# Patient Record
Sex: Female | Born: 1965 | Hispanic: Yes | Marital: Married | Smoking: Current every day smoker
Health system: Southern US, Community
[De-identification: ages and names within clinical notes are randomized; demographics above are authoritative.]

## PROBLEM LIST (undated history)

## (undated) DIAGNOSIS — Z9889 Other specified postprocedural states: Secondary | ICD-10-CM

## (undated) DIAGNOSIS — I10 Essential (primary) hypertension: Secondary | ICD-10-CM

## (undated) DIAGNOSIS — R112 Nausea with vomiting, unspecified: Secondary | ICD-10-CM

## (undated) HISTORY — PX: TUBAL LIGATION: SHX77

## (undated) HISTORY — DX: Essential (primary) hypertension: I10

---

## 2005-07-25 ENCOUNTER — Other Ambulatory Visit: Admission: RE | Admit: 2005-07-25 | Discharge: 2005-07-25 | Payer: Self-pay | Admitting: Family Medicine

## 2015-01-06 ENCOUNTER — Telehealth: Payer: Self-pay | Admitting: Family Medicine

## 2015-01-06 NOTE — Telephone Encounter (Signed)
Patient is going to talk to billing and then will let us know if decides to be seen

## 2015-07-28 ENCOUNTER — Ambulatory Visit: Payer: Self-pay | Admitting: Family Medicine

## 2016-04-20 ENCOUNTER — Ambulatory Visit: Payer: Self-pay | Admitting: Family Medicine

## 2016-04-27 ENCOUNTER — Ambulatory Visit (INDEPENDENT_AMBULATORY_CARE_PROVIDER_SITE_OTHER): Payer: Self-pay | Admitting: Pediatrics

## 2016-04-27 ENCOUNTER — Encounter (INDEPENDENT_AMBULATORY_CARE_PROVIDER_SITE_OTHER): Payer: Self-pay

## 2016-04-27 ENCOUNTER — Encounter: Payer: Self-pay | Admitting: Pediatrics

## 2016-04-27 VITALS — BP 106/76 | HR 81 | Temp 97.7°F | Ht 64.0 in | Wt 164.8 lb

## 2016-04-27 DIAGNOSIS — R51 Headache: Secondary | ICD-10-CM

## 2016-04-27 DIAGNOSIS — I779 Disorder of arteries and arterioles, unspecified: Secondary | ICD-10-CM

## 2016-04-27 DIAGNOSIS — G8929 Other chronic pain: Secondary | ICD-10-CM

## 2016-04-27 DIAGNOSIS — R0981 Nasal congestion: Secondary | ICD-10-CM

## 2016-04-27 DIAGNOSIS — I739 Peripheral vascular disease, unspecified: Principal | ICD-10-CM

## 2016-04-27 NOTE — Progress Notes (Signed)
  Subjective:   Patient ID: Vicki Robinson, female    DOB: April 12, 1966, 50 y.o.   MRN: [id number] CC: New Patient (Initial Visit)  HPI: Vicki Robinson is a 50 y.o. female presenting for New Patient (Initial Visit)  Recently had dental xrays taken Small R side carotid artery calcification seen on film, asked to follow up with PCP No symptoms Has never had stroke/heart attack Does smoke about 5 cig a day Interested in quitting, husband is a heavy smoker, has been hard Doesn't want medication to help with quitting now Denies weakness, vision changes, blurry vision, sensation changes, focal neurologic findings No trouble with BP, never needed to be on medication Has regular headaches, takes BC powder daily No history of diabetes Gets blood work down at gynecologist yearly Says her total cholesterol was 202 this past year, doesn't remember other numbers  PMH: tobacco use  Family History  Problem Relation Age of Onset  . Arthritis Mother   . Depression Mother   . Hypertension Mother   . Miscarriages / [place] Mother   . Stroke Mother   . Arthritis Brother   . COPD Brother   . Hearing loss Brother   . Cancer Maternal Grandmother   mother might have had mini strokes, pt isnt sure  Social History   Social History  . Marital status: Unknown    Spouse name: N/A  . Number of children: N/A  . Years of education: N/A   Social History Main Topics  . Smoking status: Current Every Day Smoker    Packs/day: 0.25    Types: Cigarettes  . Smokeless tobacco: Never Used  . Alcohol use No  . Drug use: No  . Sexual activity: Not Asked   Other Topics Concern  . None   Social History Narrative  . None   ROS: All systems negative other than what is in HPI  Objective:    BP 106/76   Pulse 81   Temp 97.7 F (36.5 C) (Oral)   Ht 5\' 4"  (1.626 m)   Wt 164 lb 12.8 oz (74.8 kg)   BMI 28.29 kg/m   Wt Readings from Last 3 Encounters:  04/27/16 164 lb 12.8 oz (74.8 kg)    Gen: NAD,  alert, cooperative with exam, NCAT, some congestion EYES: EOMI, no conjunctival injection, or no icterus ENT:  TMs dull gray b/l, OP with mild erythema LYMPH: no cervical LAD CV: NRRR, normal S1/S2, no murmur, distal pulses 2+ b/l, no carotid bruits Resp: CTABL, no wheezes, normal WOB Abd: +BS, soft, NTND. no guarding or organomegaly Ext: No edema, warm Neuro: Alert and oriented, strength equal b/l UE and LE, coordination grossly normal MSK: normal muscle bulk  Assessment & Plan:  Vicki Robinson was seen today for new patient (initial visit), follow up med problems  Diagnoses and all orders for this visit:  Right-sided carotid artery disease (HCC) Asymptomatic Small calcification seen likely in carotid artery on panorex dental film Discussed options Pt without symptoms, so will have her take daily aspirin Cont to work on smoking cessation  Chronic nonintractable headache, unspecified headache type Discussed rebound headaches, try to decrease caffeine, dont take OTC meds daily  Nasal congestion Try flonase  Follow up plan: prn Rex Krasarol Dolores Ewing, MD [facility]

## 2016-04-27 NOTE — Patient Instructions (Signed)
Take baby aspirin daily

## 2016-05-02 ENCOUNTER — Ambulatory Visit: Payer: Self-pay | Admitting: Family Medicine

## 2016-05-27 ENCOUNTER — Ambulatory Visit: Payer: Self-pay | Admitting: Physician Assistant

## 2016-12-26 ENCOUNTER — Ambulatory Visit (INDEPENDENT_AMBULATORY_CARE_PROVIDER_SITE_OTHER): Payer: Self-pay

## 2016-12-26 ENCOUNTER — Ambulatory Visit (INDEPENDENT_AMBULATORY_CARE_PROVIDER_SITE_OTHER): Payer: Self-pay | Admitting: Family

## 2016-12-26 ENCOUNTER — Encounter: Payer: Self-pay | Admitting: Family

## 2016-12-26 VITALS — BP 113/76 | HR 82 | Temp 98.6°F | Ht 64.0 in | Wt 160.0 lb

## 2016-12-26 DIAGNOSIS — K5900 Constipation, unspecified: Secondary | ICD-10-CM

## 2016-12-26 DIAGNOSIS — R1084 Generalized abdominal pain: Secondary | ICD-10-CM

## 2016-12-26 MED ORDER — POLYETHYLENE GLYCOL 3350 17 G PO PACK: 17.0000 g | PACK | Freq: Every day | ORAL | 6 refills | Status: DC

## 2016-12-26 NOTE — Patient Instructions (Signed)

## 2016-12-26 NOTE — Progress Notes (Signed)
   Subjective:    Patient ID: Vicki DuffMaria Lannom, female    DOB: 17-Apr-1966, 51 y.o.   MRN: [id number]  Pt presents to the office today with intermittent abdominal in RUQ and LUQ that is worse after eating. Pt states she feels bloated and has hx of constipation. States she usually has a BM every 3-4 days.  Abdominal Pain  This is a new problem. The current episode started 1 to 4 weeks ago. The onset quality is gradual. The problem occurs intermittently. The problem has been unchanged. The pain is located in the LUQ and RUQ. The pain is at a severity of 1/10. The pain is mild. The abdominal pain does not radiate. Associated symptoms include constipation and nausea. Pertinent negatives include no belching, dysuria, flatus, frequency, hematuria or vomiting. The pain is aggravated by eating. The pain is relieved by nothing. She has tried antacids for the symptoms. The treatment provided mild relief.      Review of Systems  Gastrointestinal: Positive for abdominal pain, constipation and nausea. Negative for flatus and vomiting.  Genitourinary: Negative for dysuria, frequency and hematuria.  All other systems reviewed and are negative.      Objective:   Physical Exam  Constitutional: She is oriented to person, place, and time. She appears well-developed and well-nourished. No distress.  HENT:  Head: Normocephalic.  Eyes: Pupils are equal, round, and reactive to light.  Cardiovascular: Normal rate, regular rhythm, normal heart sounds and intact distal pulses.   No murmur heard. Pulmonary/Chest: Effort normal and breath sounds normal. No respiratory distress. She has no wheezes.  Abdominal: Soft. Bowel sounds are normal. She exhibits no distension. There is tenderness (mild LLQ).  Musculoskeletal: Normal range of motion. She exhibits no edema or tenderness.  Neurological: She is alert and oriented to person, place, and time.  Skin: Skin is warm and dry.  Psychiatric: She has a normal mood and  affect. Her behavior is normal. Judgment and thought content normal.  Vitals reviewed.  KUB- Large amount of stool in colon  BP 113/76   Pulse 82   Temp 98.6 F (37 C) (Oral)   Ht 5\' 4"  (1.626 m)   Wt 160 lb (72.6 kg)   BMI 27.46 kg/m      Assessment & Plan:  1. Generalized abdominal pain - DG Abd 1 View; Future  2. Constipation, unspecified constipation type Force fluids Take Miralax daily RTO prn or if symptoms worsen  - DG Abd 1 View; Future - polyethylene glycol (MIRALAX / GLYCOLAX) packet; Take 17 g by mouth daily.  Dispense: 30 each; Refill: 6   Jannifer Rodneyhristy Lunden Stieber, FNP

## 2017-01-23 ENCOUNTER — Ambulatory Visit: Payer: Self-pay | Admitting: Family Medicine

## 2017-01-24 ENCOUNTER — Encounter: Payer: Self-pay | Admitting: Pediatrics

## 2017-02-10 ENCOUNTER — Other Ambulatory Visit: Payer: Self-pay | Admitting: Family Medicine

## 2017-02-10 ENCOUNTER — Encounter: Payer: Self-pay | Admitting: Family Medicine

## 2017-02-10 ENCOUNTER — Ambulatory Visit (INDEPENDENT_AMBULATORY_CARE_PROVIDER_SITE_OTHER): Payer: Self-pay | Admitting: Family Medicine

## 2017-02-10 VITALS — Temp 97.1°F | Ht 64.0 in | Wt 159.0 lb

## 2017-02-10 DIAGNOSIS — H6121 Impacted cerumen, right ear: Secondary | ICD-10-CM

## 2017-02-10 DIAGNOSIS — F17200 Nicotine dependence, unspecified, uncomplicated: Secondary | ICD-10-CM

## 2017-02-10 DIAGNOSIS — K5904 Chronic idiopathic constipation: Secondary | ICD-10-CM

## 2017-02-10 DIAGNOSIS — H938X3 Other specified disorders of ear, bilateral: Secondary | ICD-10-CM

## 2017-02-10 DIAGNOSIS — R030 Elevated blood-pressure reading, without diagnosis of hypertension: Secondary | ICD-10-CM | POA: Insufficient documentation

## 2017-02-10 MED ORDER — POLYETHYLENE GLYCOL 3350 17 GM/SCOOP PO POWD: 17.0000 g | Freq: Two times a day (BID) | ORAL | 0 refills | Status: DC | PRN

## 2017-02-10 NOTE — Assessment & Plan Note (Signed)
I do not think patient is actually failed MiraLAX. She likely needs a formal clean out. Instructions given for cleanout as follows: Mix 4 capfuls of MiraLAX in 32 ounces of water and drink over the course of 1-2 hours. May also use stool softener as directed. Patient to take 1 capful of MiraLAX in 8 ounces of water daily. Titration instructions reviewed with patient so that she is having one to 3 soft stools daily. Mesenteric release also performed today. See procedure note. I encouraged oral hydration, daily physical exercise, and a diet rich in fiber. Patient is also due for screening colonoscopy. I encouraged her to follow up with her primary care doctor for preventative care/annual physical exam.

## 2017-02-10 NOTE — Assessment & Plan Note (Signed)
Patient is contemplative. Currently smoking 5 cigarettes per day. She seems to recognize the impact this has on her health. I counseled her on the implications of smoking/tobacco on her overall health. Continue to address/counsel patient on tobacco use at each visit.

## 2017-02-10 NOTE — Patient Instructions (Signed)
Thank you for coming in to clinic today.  For your constipation:  1. Your symptoms are consistent with Constipation, likely cause of your General Abdominal Pain / Cramping. 2. Start with Miralax (you can get the generic version over the counter). First dose 68g (4 capfuls) in 32oz water over 1 to 2 hours for clean out. Next day start 17g or 1 capful daily, may adjust dose up or down by half a capful every few days. Recommend to take this medicine daily for next 1-2 weeks, then may need to use it longer if needed. - Goal is to have soft regular bowel movement 1-3x daily, if too runny or diarrhea, then reduce dose of the medicine  Improve water intake, hydration will help Also recommend increased vegetables, fruits, fiber intake Can try daily Metamucil or Fiber supplement at pharmacy over the counter  Follow-up if symptoms are not improving with bowel movements, or if pain worsens, develop fevers, nausea, vomiting.  If you have any other questions or concerns, please feel free to call the clinic to contact me. You may also schedule an earlier appointment if necessary.  For your sinus pressure and nasal allergies:  - Try to breathe moist air. Use a humidifier or take a steamy shower. - For nasal stuffiness, try saline nasal spray or a Neti Pot. - You can consider starting Flonase nasal spray (generic is fine) if your symptoms are not improving with the saline nasal rinses.  Directions: Spray 2 sprays in each nostril one time daily.  It may take up to 2 weeks before you notice improvement in your symptoms.  Therefore, it is important to use this consistently. - Avoid dairy products, as they can thicken phlegm.  CONTACT YOUR DOCTOR IF YOU EXPERIENCE ANY OF THE FOLLOWING: - High fever - Ear pain - Sinus-type headache - Unusually severe cold symptoms - Cough that gets worse while other cold symptoms improve - Flare up of any chronic lung problem, such as asthma - Your symptoms persist longer  than 2 weeks   Nasal Allergies Nasal allergies are a reaction to allergens in the air. Allergens are tiny specks (particles) in the air that cause your body to have an allergic reaction. Nasal allergies are not passed from person to person (contagious). They cannot be cured, but they can be controlled. Common causes of nasal allergies include:  Pollen from grasses, trees, and weeds.  House dust mites.  Pet dander.  Mold.  Follow these instructions at home:  Avoid the allergen that is causing your symptoms, if you can.  Keep windows closed. If possible, use air conditioning when there is a lot of pollen in the air.  Do not use fans in your home.  Do not hang clothes outside to dry.  Wear sunglasses to keep pollen out of your eyes.  Wash your hands right away after you touch household pets.  Take over-the-counter and prescription medicines only as told by your doctor.  Keep all follow-up visits as told by your doctor. This is important. Contact a doctor if:  You have a fever.  You have a cough that does not go away (is persistent).  You start to make whistling sounds when you breathe (wheeze).  Your symptoms do not get better with treatment.  You have thick fluid coming from your nose.  You start to have nosebleeds. Get help right away if:  Your tongue or your lips are swollen.  You have trouble breathing.  You feel light-headed or you feel  like you are going to pass out (faint).  You have cold sweats. This information is not intended to replace advice given to you by your health care provider. Make sure you discuss any questions you have with your health care provider. Document Released: 09/29/2010 Document Revised: 11/05/2015 Document Reviewed: 12/10/2014 Elsevier Interactive Patient Education  [employer].   However, if your symptoms get significantly worse, please go to the Emergency Department to seek immediate medical attention.

## 2017-02-10 NOTE — Progress Notes (Addendum)
Subjective: CC:ear fullness, constipation PCP: [name], MD RUE:AVWUJ Vicki Robinson is a 51 y.o. female presenting to clinic today for:  Constipation Patient seen in July for constipation. She had an abdominal x-ray done at that time as well, which supported a diagnosis. She was discharged with MiraLAX for home use. Unfortunately she said that the prescription version was $100+. She instead obtained an over-the-counter version. She notes that she was taking 1 capsule twice a day with little to no improvement in symptoms. She has also been using a daily stool softener. She reports that last night she actually ended up using half a bottle of magnesium citrate as well as 2 stool softeners. She notes a small hard bowel movement off of this. She reports fair fluid intake, citing that she drinks between 4 and 5 glasses of water daily. She reports good fiber intake, citing that she eats fruit daily. She denies excessive caffeine use. She is active for at least 30 minutes daily, walking.  She reports that she normally has a bowel movement maybe every 3-4 days. Bowel movements are nonbloody, no melena. She denies straining, rectal pain, rectal itching or any other signs or symptoms of hemorrhoids.  She has not yet had a colonoscopy because she is uninsured. Denies nausea, vomiting, abdominal pain, early satiety. Endorses reflux occasionally, especially after eating acidic/greasy foods. This is relieved by Tums.  Ear fullness Patient reports a three-day history of intermittent ear fullness and muffled hearing. She denies Q-tip use. She reports associated sinus congestion and pressure. She reports feeling lightheaded briefly at work earlier today, which is why she scheduled this appointment for evaluation. No loss of consciousness, no falls, no gait abnormalities, no visual disturbance, no vertigo. She denies fevers, chills, sick contacts, purulence from sinuses, cough, congestion, rhinorrhea. She endorses nasal  congestion. She reports a history of headaches, for which she takes a BC powder daily. She notes that when she stops taking BC powder she has rebound headaches. No nausea, vomiting, neurologic deficits.   Allergies  Allergen Reactions  . Penicillins Hives   No past medical history on file. Family History  Problem Relation Age of Onset  . Arthritis Mother   . Depression Mother   . Hypertension Mother   . Miscarriages / [place] Mother   . Stroke Mother   . Arthritis Brother   . COPD Brother   . Hearing loss Brother   . Cancer Maternal Grandmother    Social Hx: daily smoker, 5 cig/d.Current medications reviewed.   ROS: Per HPI  Objective: Office vital signs reviewed. BP 142/90. Temperature (!) 97.1 F (36.2 C), temperature source Oral, height 5\' 4"  (1.626 m), weight 159 lb (72.1 kg).  Physical Examination:  General: Awake, alert, well nourished, well appearing female, No acute distress HEENT: Normal; no sinus tenderness to palpation.    Neck: No masses palpated. No lymphadenopathy    Ears: R Tympanic membrane intact Initially occluded by cerumen. After irrigation, right TM intact w/ normal light reflex, no erythema, no bulging; left TM intact with normal light reflex, no erythema, no bulging. Bilateral external auditory canals unremarkable.    Eyes: PERRLA, extraocular membranes intact, sclera white    Nose: nasal turbinates moist but edematous. No erythema, no nasal discharge    Throat: moist mucus membranes, no erythema, no tonsillar exudate.  Airway is patent Cardio: regular rate and rhythm, S1S2 heard, no murmurs appreciated Pulm: clear to auscultation bilaterally, no wheezes, rhonchi or rales; normal work of breathing on room air  GI: Soft, mild epigastric tenderness, nondistended,+ bowel sounds 4. No palpable masses. No hepato-splenomegaly. MSK: normal gait and normal station Skin: dry; intact; no rashes or lesions Neuro:No nystagmus, no focal neurologic  deficits.  Orthostatic VS for the past 24 hrs:  BP- Lying Pulse- Lying BP- Sitting Pulse- Sitting BP- Standing at 0 minutes Pulse- Standing at 0 minutes  02/10/17 1526 143/83 68 148/86 68 142/90 79   OMT procedure note: Verbal consent was given for OMT procedure. Patient was placed in supine position with her knee slightly flexed. Providers hands were placed just medial to the ASIS and abdomen was gently lifted in a scooping manner. This position was held for approximately 30 seconds. Attention was then paid to the transverse colon, where again providers hands were placed in a scooping motion and held for approximately 30 seconds. This technique was repeated along descending/sigmoid colon. Patient tolerated procedure well. There were no immediate complications.  Assessment/ Plan: 51 y.o. female   Chronic idiopathic constipation I do not think patient is actually failed MiraLAX. She likely needs a formal clean out. Instructions given for cleanout as follows: Mix 4 capfuls of MiraLAX in 32 ounces of water and drink over the course of 1-2 hours. May also use stool softener as directed. Patient to take 1 capful of MiraLAX in 8 ounces of water daily. Titration instructions reviewed with patient so that she is having one to 3 soft stools daily. Mesenteric release also performed today. See procedure note. I encouraged oral hydration, daily physical exercise, and a diet rich in fiber. Patient is also due for screening colonoscopy. I encouraged her to follow up with her primary care doctor for preventative care/annual physical exam.  Ear fullness, bilateral No evidence of infection on exam. Her right ear was remarkable for cerumen impaction. This was irrigated and removed during today's exam. Because she had reported sensation of lightheadedness, orthostatic vital signs were obtained which were negative. She had no nystagmus on exam. I do not think this is BPPV but rather ear fullness related to sinus  pressure/fluid. I encouraged her to stop smoking #1. I reviewed sinus rinses/saline nasal spray. If this is ineffective, patient may use Flonase nasal spray 2 sprays in each nostril once daily. I reviewed with her that this may take up to 2 weeks before she sees a noticeable effect. I encouraged her to use this consistently. Pseudoephedrine was considered, but patient with elevated blood pressures today. For this reason, this medication was not recommended. Return precautions were reviewed. Patient to follow-up when necessary.  Elevated blood pressure reading Blood pressures were elevated slightly today. It appears that she's been normotensive in the past. For this reason, No medications were initiated today. I did encourage her to follow up with her primary care doctor regarding her blood pressure. If persistently elevated would initiate antihypertensive, especially in the setting of right-sided carotid artery disease. Patient voiced good understanding.  Tobacco use disorder Patient is contemplative. Currently smoking 5 cigarettes per day. She seems to recognize the impact this has on her health. I counseled her on the implications of smoking/tobacco on her overall health. Continue to address/counsel patient on tobacco use at each visit.    Raliegh IpAshly M Gottschalk, DO [facility] [phone]

## 2017-02-10 NOTE — Assessment & Plan Note (Addendum)
Blood pressures were elevated slightly today. It appears that she's been normotensive in the past. For this reason, No medications were initiated today. I did encourage her to follow up with her primary care doctor regarding her blood pressure. If persistently elevated would initiate antihypertensive, especially in the setting of right-sided carotid artery disease. Patient voiced good understanding.

## 2017-02-10 NOTE — Assessment & Plan Note (Addendum)
No evidence of infection on exam. Her right ear was remarkable for cerumen impaction. This was irrigated and removed during today's exam. Because she had reported sensation of lightheadedness, orthostatic vital signs were obtained which were negative. She had no nystagmus on exam. I do not think this is BPPV but rather ear fullness related to sinus pressure/fluid. I encouraged her to stop smoking #1. I reviewed sinus rinses/saline nasal spray. If this is ineffective, patient may use Flonase nasal spray 2 sprays in each nostril once daily. I reviewed with her that this may take up to 2 weeks before she sees a noticeable effect. I encouraged her to use this consistently. Pseudoephedrine was considered, but patient with elevated blood pressures today. For this reason, this medication was not recommended. Return precautions were reviewed. Patient to follow-up when necessary.

## 2017-02-15 ENCOUNTER — Telehealth: Payer: Self-pay | Admitting: Family Medicine

## 2017-02-15 NOTE — Telephone Encounter (Signed)
She can purchase Sudafed from the pharmacy.  This can help with sinus pressure/ congestion.  I would recommend NOT using this near bedtime, as it can cause insomnia.  Would also not use longer than 3-4 days.

## 2017-02-15 NOTE — Telephone Encounter (Signed)
Pt aware by detailed VM 

## 2017-07-26 ENCOUNTER — Telehealth: Payer: Self-pay | Admitting: Pediatrics

## 2017-07-26 NOTE — Telephone Encounter (Signed)
Left detailed message for pt 

## 2017-07-26 NOTE — Telephone Encounter (Signed)
Patient is requesting that labwork be done and reviewed by you without an office visit. Please advise

## 2017-07-26 NOTE — Telephone Encounter (Signed)
Needs appt, havent seen her in a year

## 2017-08-07 ENCOUNTER — Encounter: Payer: Self-pay | Admitting: Pediatrics

## 2017-08-07 ENCOUNTER — Ambulatory Visit (INDEPENDENT_AMBULATORY_CARE_PROVIDER_SITE_OTHER): Payer: Self-pay | Admitting: Pediatrics

## 2017-08-07 VITALS — BP 134/77 | HR 64 | Temp 97.5°F | Ht 64.0 in | Wt 163.6 lb

## 2017-08-07 DIAGNOSIS — M545 Low back pain: Secondary | ICD-10-CM

## 2017-08-07 DIAGNOSIS — G8929 Other chronic pain: Secondary | ICD-10-CM

## 2017-08-07 DIAGNOSIS — Z0000 Encounter for general adult medical examination without abnormal findings: Secondary | ICD-10-CM

## 2017-08-07 NOTE — Progress Notes (Signed)
  Subjective:   Patient ID: Vicki Robinson, female    DOB: 1966/06/02, 52 y.o.   MRN: [id number] CC: Annual Exam (No pap)  HPI: Vicki DuffMaria Doleman is a 52 y.o. female presenting for Annual Exam (No pap)  Works as Conservation officer, naturecashier at [employer], starts having LBP about 4-5 hours into standing. Not taking anything for the pain.   Has had stuffy nose, some coughing for past 5-6 days. No fevers, nl appetite. Taking tussin and equate as needed.   Has veins in front of R knee, swell at times. No pain, not solid.  R hand middle finger tip sometimes feels numb. Can be at night, sometimes during day. No swelling, redness or injury.  Husband smoker, recently with very elevated RBC, pt worried she may have as well. havent been able to find cause for his. He is a smoker.   Relevant past medical, surgical, family and social history reviewed. Allergies and medications reviewed and updated. Social History   Has quit smoking about 1 mo ago.  ROS: Per HPI   Objective:    BP 134/77   Pulse 64   Temp (!) 97.5 F (36.4 C) (Oral)   Ht 5\' 4"  (1.626 m)   Wt 163 lb 9.6 oz (74.2 kg)   BMI 28.08 kg/m   Wt Readings from Last 3 Encounters:  08/07/17 163 lb 9.6 oz (74.2 kg)  02/10/17 159 lb (72.1 kg)  12/26/16 160 lb (72.6 kg)    Gen: NAD, alert, cooperative with exam, NCAT EYES: EOMI, no conjunctival injection, or no icterus ENT:  TMs pearly gray b/l, OP without erythema LYMPH: no cervical LAD CV: NRRR, normal S1/S2, no murmur, distal pulses 2+ b/l Resp: CTABL, no wheezes, normal WOB Abd: +BS, soft, NTND. no guarding or organomegaly Ext: No edema, warm Neuro: Alert and oriented, strength equal b/l UE and LE, coordination grossly normal MSK: normal muscle bulk  Assessment & Plan:  Byrd HesselbachMaria was seen today for annual exam.  Diagnoses and all orders for this visit:  Encounter for preventive care -     CBC with Differential  Chronic bilateral low back pain without sciatica Gentle back exercises, stretches given.  Rest, ice, heat. Avoid movements that make it worse.   Follow up plan: 1 yr Rex Krasarol Vincent, MD [facility]

## 2017-08-07 NOTE — Patient Instructions (Signed)
Back Exercises If you have pain in your back, do these exercises 2-3 times each day or as told by your doctor. When the pain goes away, do the exercises once each day, but repeat the steps more times for each exercise (do more repetitions). If you do not have pain in your back, do these exercises once each day or as told by your doctor. Exercises Single Knee to Chest  Do these steps 3-5 times in a row for each leg: 1. Lie on your back on a firm bed or the floor with your legs stretched out. 2. Bring one knee to your chest. 3. Hold your knee to your chest by grabbing your knee or thigh. 4. Pull on your knee until you feel a gentle stretch in your lower back. 5. Keep doing the stretch for 10-30 seconds. 6. Slowly let go of your leg and straighten it.  Pelvic Tilt  Do these steps 5-10 times in a row: 1. Lie on your back on a firm bed or the floor with your legs stretched out. 2. Bend your knees so they point up to the ceiling. Your feet should be flat on the floor. 3. Tighten your lower belly (abdomen) muscles to press your lower back against the floor. This will make your tailbone point up to the ceiling instead of pointing down to your feet or the floor. 4. Stay in this position for 5-10 seconds while you gently tighten your muscles and breathe evenly.  Cat-Cow  Do these steps until your lower back bends more easily: 1. Get on your hands and knees on a firm surface. Keep your hands under your shoulders, and keep your knees under your hips. You may put padding under your knees. 2. Let your head hang down, and make your tailbone point down to the floor so your lower back is round like the back of a cat. 3. Stay in this position for 5 seconds. 4. Slowly lift your head and make your tailbone point up to the ceiling so your back hangs low (sags) like the back of a cow. 5. Stay in this position for 5 seconds.  Press-Ups  Do these steps 5-10 times in a row: 1. Lie on your belly (face-down)  on the floor. 2. Place your hands near your head, about shoulder-width apart. 3. While you keep your back relaxed and keep your hips on the floor, slowly straighten your arms to raise the top half of your body and lift your shoulders. Do not use your back muscles. To make yourself more comfortable, you may change where you place your hands. 4. Stay in this position for 5 seconds. 5. Slowly return to lying flat on the floor.  Bridges  Do these steps 10 times in a row: 1. Lie on your back on a firm surface. 2. Bend your knees so they point up to the ceiling. Your feet should be flat on the floor. 3. Tighten your butt muscles and lift your butt off of the floor until your waist is almost as high as your knees. If you do not feel the muscles working in your butt and the back of your thighs, slide your feet 1-2 inches farther away from your butt. 4. Stay in this position for 3-5 seconds. 5. Slowly lower your butt to the floor, and let your butt muscles relax.  If this exercise is too easy, try doing it with your arms crossed over your chest. Back Lifts Do these steps 5-10 times in a   row: 1. Lie on your belly (face-down) with your arms at your sides, and rest your forehead on the floor. 2. Tighten the muscles in your legs and your butt. 3. Slowly lift your chest off of the floor while you keep your hips on the floor. Keep the back of your head in line with the curve in your back. Look at the floor while you do this. 4. Stay in this position for 3-5 seconds. 5. Slowly lower your chest and your face to the floor.  Contact a doctor if:  Your back pain gets a lot worse when you do an exercise.  Your back pain does not lessen 2 hours after you exercise. If you have any of these problems, stop doing the exercises. Do not do them again unless your doctor says it is okay. Get help right away if:  You have sudden, very bad back pain. If this happens, stop doing the exercises. Do not do them again  unless your doctor says it is okay. This information is not intended to replace advice given to you by your health care provider. Make sure you discuss any questions you have with your health care provider. Document Released: 07/02/2010 Document Revised: 11/05/2015 Document Reviewed: 07/24/2014 Elsevier Interactive Patient Education  2018 [employer]   

## 2017-08-08 LAB — CBC WITH DIFFERENTIAL/PLATELET
BASOS ABS: 0 10*3/uL (ref 0.0–0.2)
Basos: 0 %
EOS (ABSOLUTE): 0.2 10*3/uL (ref 0.0–0.4)
Eos: 2 %
Hematocrit: 39.5 % (ref 34.0–46.6)
Hemoglobin: 13.1 g/dL (ref 11.1–15.9)
Immature Grans (Abs): 0 10*3/uL (ref 0.0–0.1)
Immature Granulocytes: 0 %
Lymphocytes Absolute: 2.6 10*3/uL (ref 0.7–3.1)
Lymphs: 37 %
MCH: 28.8 pg (ref 26.6–33.0)
MCHC: 33.2 g/dL (ref 31.5–35.7)
MCV: 87 fL (ref 79–97)
MONOS ABS: 0.4 10*3/uL (ref 0.1–0.9)
Monocytes: 5 %
NEUTROS ABS: 3.8 10*3/uL (ref 1.4–7.0)
Neutrophils: 56 %
PLATELETS: 309 10*3/uL (ref 150–379)
RBC: 4.55 x10E6/uL (ref 3.77–5.28)
RDW: 14.2 % (ref 12.3–15.4)
WBC: 6.9 10*3/uL (ref 3.4–10.8)

## 2017-08-09 ENCOUNTER — Encounter: Payer: Self-pay | Admitting: Pediatrics

## 2017-09-10 ENCOUNTER — Encounter: Payer: Self-pay | Admitting: Family Medicine

## 2017-09-12 ENCOUNTER — Other Ambulatory Visit: Payer: Self-pay | Admitting: Family Medicine

## 2017-09-12 NOTE — Progress Notes (Signed)
error 

## 2017-10-13 ENCOUNTER — Encounter: Payer: Self-pay | Admitting: Family Medicine

## 2017-10-16 ENCOUNTER — Ambulatory Visit (INDEPENDENT_AMBULATORY_CARE_PROVIDER_SITE_OTHER): Payer: Self-pay | Admitting: Family Medicine

## 2017-10-16 ENCOUNTER — Encounter: Payer: Self-pay | Admitting: Family Medicine

## 2017-10-16 ENCOUNTER — Ambulatory Visit (INDEPENDENT_AMBULATORY_CARE_PROVIDER_SITE_OTHER): Payer: Self-pay

## 2017-10-16 VITALS — BP 115/81 | HR 79 | Temp 97.8°F | Ht 63.0 in | Wt 163.0 lb

## 2017-10-16 DIAGNOSIS — M546 Pain in thoracic spine: Secondary | ICD-10-CM

## 2017-10-16 DIAGNOSIS — M545 Low back pain, unspecified: Secondary | ICD-10-CM

## 2017-10-16 DIAGNOSIS — G8929 Other chronic pain: Secondary | ICD-10-CM

## 2017-10-16 DIAGNOSIS — K5904 Chronic idiopathic constipation: Secondary | ICD-10-CM

## 2017-10-16 MED ORDER — DICLOFENAC SODIUM 75 MG PO TBEC: 75.0000 mg | DELAYED_RELEASE_TABLET | Freq: Two times a day (BID) | ORAL | 0 refills | Status: DC | PRN

## 2017-10-16 NOTE — Progress Notes (Signed)
Subjective: CC: back pain PCP: Vicki Sheriff, MD RUE:AVWUJ Vicki Robinson is a 52 y.o. female presenting to clinic today for:  1. Back pain Patient has had a several month history of thoracic back pain radiating to bilateral anterior ribs and low back pain radiating to right buttock.  She denies any lower extremity weakness, numbness, tingling, saddle anesthesia, fecal incontinence or urinary retention.  Actions like sneezing can make the thoracic back pain worse.  She notes that symptoms seem to be most prominent after she is worked a long shift at [employer].  She typically uses a TENS unit and heat to help symptoms.  Pain is also relieved by not have to go to work.  She has used meloxicam in the past with little improvement.  2.  Constipation Constipation seems to be improving with the MiraLAX powder.  No melena, hematochezia.  She notes that she does tend to have some back pain when she has not had a normal bowel movement.  Sometimes she will have nausea when laying on her right side.  No vomiting.  Tolerating fluids and food without difficulty.  ROS: Per HPI  Allergies  Allergen Reactions  . Penicillins Hives   No past medical history on file.  Current Outpatient Medications:  .  Aspirin-Salicylamide-Caffeine (BC HEADACHE POWDER PO), Take by mouth daily., Disp: , Rfl:  .  polyethylene glycol powder (GLYCOLAX/MIRALAX) powder, Take 17 g by mouth 2 (two) times daily as needed. (Patient not taking: Reported on 08/07/2017), Disp: 225 g, Rfl: 0 Social History   Socioeconomic History  . Marital status: Married    Spouse name: Not on file  . Number of children: Not on file  . Years of education: Not on file  . Highest education level: Not on file  Occupational History  . Not on file  Social Needs  . Financial resource strain: Not on file  . Food insecurity:    Worry: Not on file    Inability: Not on file  . Transportation needs:    Medical: Not on file    Non-medical: Not on file    Tobacco Use  . Smoking status: Current Every Day Smoker    Packs/day: 0.25    Types: Cigarettes  . Smokeless tobacco: Never Used  Substance and Sexual Activity  . Alcohol use: No  . Drug use: No  . Sexual activity: Not on file  Lifestyle  . Physical activity:    Days per week: Not on file    Minutes per session: Not on file  . Stress: Not on file  Relationships  . Social connections:    Talks on phone: Not on file    Gets together: Not on file    Attends religious service: Not on file    Active member of club or organization: Not on file    Attends meetings of clubs or organizations: Not on file    Relationship status: Not on file  . Intimate partner violence:    Fear of current or ex partner: Not on file    Emotionally abused: Not on file    Physically abused: Not on file    Forced sexual activity: Not on file  Other Topics Concern  . Not on file  Social History Narrative  . Not on file   Family History  Problem Relation Age of Onset  . Arthritis Mother   . Depression Mother   . Hypertension Mother   . Miscarriages / [place] Mother   . Stroke Mother   .  Arthritis Brother   . COPD Brother   . Hearing loss Brother   . Cancer Maternal Grandmother     Objective: Office vital signs reviewed. BP 115/81   Pulse 79   Temp 97.8 F (36.6 C) (Oral)   Ht  (1.6 m)   Wt 163 lb (73.9 kg)   BMI 28.87 kg/m   Physical Examination:  General: Awake, alert, well nourished, No acute distress HEENT: Normal    Eyes: PERRLA, extraocular membranes intact, sclera whitw    Throat: moist mucus membranes Pulm:  normal work of breathing on room air Extremities: warm, well perfused, No edema, cyanosis or clubbing; +2 pulses bilaterally MSK: normal gait and normal station   Thoracic spine: Patient has full active range of motion in all planes.  No midline tenderness to palpation.  No paraspinal muscle tenderness to palpation.  No tenderness to palpation to the ribs.  No  palpable bony deformities or abnormalities.  She has a slight scoliotic curve appreciated at the thoracolumbar junction.  Lumbar spine: Patient has full active range of motion in all planes.  No midline tenderness to palpation.  No paraspinal muscle tenderness to palpation.  No palpable bony abnormalities.  Scoliotic curve as above. Neuro: 5/5 lower extremity strength and light touch sensation grossly intact, patellar DTRs 2/4  No results found.   Assessment/ Plan: 52 y.o. female   1. Chronic bilateral thoracic back pain Very mild degenerative changes appreciated within the thoracic spine.  No appreciable fractures or degenerative disc changes.  Findings were reviewed with the patient.  Continue TENS unit and heat as needed.  I have also provided her a small course of Voltaren to use as needed as directed on work days.  She will follow-up as needed. - DG Thoracic Spine 2 View; Future  2. Chronic bilateral low back pain without sciatica Mild degenerative changes within the lumbar spine noted.  There is a questionable degenerative disc in L4 on L5 and L5 on S1.  TENS unit, heat and Voltaren as above.  Follow-up as needed. - DG Lumbar Spine 2-3 Views; Future  3. Chronic idiopathic constipation Continue hydration, MiraLAX as needed.  Follow-up as needed.   Orders Placed This Encounter  Procedures  . DG Thoracic Spine 2 View    Standing Status:   Future    Number of Occurrences:   1    Standing Expiration Date:   12/16/2018    Order Specific Question:   Reason for Exam (SYMPTOM  OR DIAGNOSIS REQUIRED)    Answer:   lumbar back pain radiating to right buttock    Order Specific Question:   Is the patient pregnant?    Answer:   No    Order Specific Question:   Preferred imaging location?    Answer:   Internal  . DG Lumbar Spine 2-3 Views    Standing Status:   Future    Number of Occurrences:   1    Standing Expiration Date:   12/16/2018    Order Specific Question:   Reason for Exam (SYMPTOM   OR DIAGNOSIS REQUIRED)    Answer:   lumbar back pain radiating to right buttock    Order Specific Question:   Is the patient pregnant?    Answer:   No    Order Specific Question:   Preferred imaging location?    Answer:   Internal   Meds ordered this encounter  Medications  . diclofenac (VOLTAREN) 75 MG EC tablet  Sig: Take 1 tablet (75 mg total) by mouth 2 (two) times daily as needed.    Dispense:  30 tablet    Refill:  0     Ashly Hulen Skains, DO [facility] [phone]

## 2017-10-18 ENCOUNTER — Encounter: Payer: Self-pay | Admitting: Family Medicine

## 2017-10-18 ENCOUNTER — Telehealth: Payer: Self-pay | Admitting: Pediatrics

## 2017-11-10 ENCOUNTER — Encounter: Payer: Self-pay | Admitting: Pediatrics

## 2017-11-16 ENCOUNTER — Encounter: Payer: Self-pay | Admitting: Pediatrics

## 2018-02-06 ENCOUNTER — Encounter: Payer: Self-pay | Admitting: Family Medicine

## 2018-02-20 ENCOUNTER — Encounter: Payer: Self-pay | Admitting: Family Medicine

## 2018-03-07 ENCOUNTER — Encounter: Payer: Self-pay | Admitting: Pediatrics

## 2018-04-18 ENCOUNTER — Encounter: Payer: Self-pay | Admitting: Pediatrics

## 2018-04-18 ENCOUNTER — Encounter: Payer: Self-pay | Admitting: Family Medicine

## 2018-04-18 NOTE — Telephone Encounter (Signed)
Will defer this to PCP 

## 2018-05-21 ENCOUNTER — Encounter: Payer: Self-pay | Admitting: Family Medicine

## 2018-07-05 ENCOUNTER — Encounter: Payer: Self-pay | Admitting: Family Medicine

## 2018-07-05 ENCOUNTER — Encounter: Payer: Self-pay | Admitting: Pediatrics

## 2018-09-10 ENCOUNTER — Encounter: Payer: Self-pay | Admitting: Family Medicine

## 2018-10-15 ENCOUNTER — Encounter: Payer: Self-pay | Admitting: Family Medicine

## 2018-10-30 ENCOUNTER — Encounter: Payer: Self-pay | Admitting: Family Medicine

## 2019-07-01 ENCOUNTER — Encounter: Payer: Self-pay | Admitting: Family Medicine

## 2019-08-12 ENCOUNTER — Telehealth: Payer: Self-pay | Admitting: Family Medicine

## 2019-08-12 ENCOUNTER — Encounter: Payer: Self-pay | Admitting: Family Medicine

## 2020-02-24 ENCOUNTER — Ambulatory Visit (INDEPENDENT_AMBULATORY_CARE_PROVIDER_SITE_OTHER): Payer: Self-pay | Admitting: Family Medicine

## 2020-02-24 ENCOUNTER — Encounter: Payer: Self-pay | Admitting: Family Medicine

## 2020-02-24 ENCOUNTER — Other Ambulatory Visit: Payer: Self-pay

## 2020-02-24 ENCOUNTER — Telehealth: Payer: Self-pay | Admitting: Family Medicine

## 2020-02-24 VITALS — BP 155/79 | HR 77 | Temp 98.0°F | Ht 63.0 in | Wt 173.0 lb

## 2020-02-24 DIAGNOSIS — K641 Second degree hemorrhoids: Secondary | ICD-10-CM

## 2020-02-24 DIAGNOSIS — K5904 Chronic idiopathic constipation: Secondary | ICD-10-CM

## 2020-02-24 NOTE — Telephone Encounter (Signed)
°  Incoming Patient Call  02/24/2020  What symptoms do you have? Bloody diarrhea, rash, High BP    How long have you been sick? Started around 5 a.m. on 02/23/2020  Have you been seen for this problem? No she called EMS and they asked if she wanted to go to ER she was feeling a little better by the time they got to her home and told her to ask her PCP  If your provider decides to give you a prescription, which pharmacy would you like for it to be sent to? [facility] [place]   Patient informed that this information will be sent to the clinical staff for review and that they should receive a follow up call.

## 2020-02-24 NOTE — Telephone Encounter (Signed)
Appt made

## 2020-02-24 NOTE — Progress Notes (Signed)
BP (!) 155/79   Pulse 77   Temp 98 F (36.7 C)   Ht 5' 3"  (1.6 m)   Wt 173 lb (78.5 kg)   SpO2 (!) 77%   BMI 30.65 kg/m    Subjective:   Patient ID: Vicki Robinson, female    DOB: [birth year], 54 y.o.   MRN: [id number]  HPI: Vicki Robinson is a 54 y.o. female presenting on 02/24/2020 for Abdominal Pain and Diarrhea   HPI Patient is coming in today complaining of 1 episode of whole body itching and then she got up to go to the restroom and then she had some abdominal cramping and then feels like she passed out after she took a Benadryl and then she was still having some cramping and had some diarrhea and a little bit of nausea.  On her third episode of diarrhea that morning which was 2 days ago she passed some bright red blood per stool and large amounts.  She has been using Preparation H for external hemorrhoids and has been using a stool softener for constipation.  She says she has not had any diarrhea today and she has not had any further lightheadedness or dizziness or feeling nauseous or abdominal cramping or pain.  She has not had any more blood per rectum either.  Patient is self-pay and has never had a colonoscopy.  Relevant past medical, surgical, family and social history reviewed and updated as indicated. Interim medical history since our last visit reviewed. Allergies and medications reviewed and updated.  Review of Systems  Constitutional: Negative for chills and fever.  HENT: Negative for congestion, ear discharge and ear pain.   Eyes: Negative for redness and visual disturbance.  Respiratory: Negative for chest tightness and shortness of breath.   Cardiovascular: Negative for chest pain and leg swelling.  Gastrointestinal: Positive for abdominal pain, blood in stool, constipation, diarrhea and nausea. Negative for abdominal distention, rectal pain and vomiting.  Genitourinary: Negative for difficulty urinating and dysuria.  Musculoskeletal: Negative for back pain and gait  problem.  Skin: Negative for rash.  Neurological: Negative for light-headedness and headaches.  Psychiatric/Behavioral: Negative for agitation and behavioral problems.  All other systems reviewed and are negative.   Per HPI unless specifically indicated above   Allergies as of 02/24/2020      Reactions   Penicillins Hives      Medication List       Accurate as of February 24, 2020  3:37 PM. If you have any questions, ask your nurse or doctor.        STOP taking these medications   polyethylene glycol powder 17 GM/SCOOP powder Commonly known as: GLYCOLAX/MIRALAX Stopped by: Fransisca Kaufmann Tommey Barret, MD     TAKE these medications   BC HEADACHE POWDER PO Take by mouth daily.   diclofenac 75 MG EC tablet Commonly known as: VOLTAREN Take 1 tablet (75 mg total) by mouth 2 (two) times daily as needed.   Stool Softener 100 MG capsule Generic drug: Docusate Sodium Take 100 mg by mouth 2 (two) times daily.        Objective:   BP (!) 155/79   Pulse 77   Temp 98 F (36.7 C)   Ht 5' 3"  (1.6 m)   Wt 173 lb (78.5 kg)   SpO2 (!) 77%   BMI 30.65 kg/m   Wt Readings from Last 3 Encounters:  02/24/20 173 lb (78.5 kg)  10/16/17 163 lb (73.9 kg)  08/07/17 163 lb 9.6 oz (  74.2 kg)    Physical Exam Vitals and nursing note reviewed. Exam conducted with a chaperone present.  Constitutional:      General: She is not in acute distress.    Appearance: She is well-developed. She is not diaphoretic.  Eyes:     Conjunctiva/sclera: Conjunctivae normal.     Pupils: Pupils are equal, round, and reactive to light.  Cardiovascular:     Rate and Rhythm: Normal rate and regular rhythm.     Heart sounds: Normal heart sounds. No murmur heard.   Pulmonary:     Effort: Pulmonary effort is normal. No respiratory distress.     Breath sounds: Normal breath sounds. No wheezing.  Abdominal:     General: Abdomen is flat. Bowel sounds are normal. There is no distension.     Tenderness: There is  no abdominal tenderness. There is no right CVA tenderness, left CVA tenderness, guarding or rebound.  Genitourinary:    Rectum: External hemorrhoid present. No tenderness, anal fissure or internal hemorrhoid. Normal anal tone.  Musculoskeletal:        General: No tenderness. Normal range of motion.  Skin:    General: Skin is warm and dry.     Findings: No rash.  Neurological:     Mental Status: She is alert and oriented to person, place, and time.     Coordination: Coordination normal.  Psychiatric:        Behavior: Behavior normal.       Assessment & Plan:   Problem List Items Addressed This Visit      Digestive   Chronic idiopathic constipation - Primary   Relevant Medications   Docusate Sodium (STOOL SOFTENER) 100 MG capsule   Other Relevant Orders   CBC with Differential/Platelet   CMP14+EGFR    Other Visit Diagnoses    Grade II hemorrhoids       Relevant Orders   CBC with Differential/Platelet   CMP14+EGFR      Patient does not have insurance, we discussed that a colonoscopy would be good for her in her future especially if she has any further bleeding, she has not had anymore and her diarrhea is resolved, will check some blood counts because she does use a good a lot of Goody powders to make sure she is not anemic or have any signs of an ulcer.  Recommended stopping Goody powders and using Tylenol. Follow up plan: Return if symptoms worsen or fail to improve.  Counseling provided for all of the vaccine components Orders Placed This Encounter  Procedures  . CBC with Differential/Platelet  . [place] Jameer Storie, MD [facility] 02/24/2020, 3:37 PM

## 2020-02-25 ENCOUNTER — Encounter: Payer: Self-pay | Admitting: Family Medicine

## 2020-02-25 LAB — CMP14+EGFR
ALT: 15 IU/L (ref 0–32)
AST: 19 IU/L (ref 0–40)
Albumin/Globulin Ratio: 1.5 (ref 1.2–2.2)
Albumin: 4.1 g/dL (ref 3.8–4.9)
Alkaline Phosphatase: 114 IU/L (ref 44–121)
BUN/Creatinine Ratio: 12 (ref 9–23)
BUN: 9 mg/dL (ref 6–24)
Bilirubin Total: <0.2 mg/dL (ref 0.0–1.2)
CO2: 20 mmol/L (ref 20–29)
Calcium: 8.8 mg/dL (ref 8.7–10.2)
Chloride: 104 mmol/L (ref 96–106)
Creatinine, Ser: 0.77 mg/dL (ref 0.57–1.00)
GFR calc Af Amer: 101 mL/min/{1.73_m2} (ref >59)
GFR calc non Af Amer: 87 mL/min/{1.73_m2} (ref >59)
Globulin, Total: 2.8 g/dL (ref 1.5–4.5)
Glucose: 92 mg/dL (ref 65–99)
Potassium: 4.2 mmol/L (ref 3.5–5.2)
Sodium: 140 mmol/L (ref 134–144)
Total Protein: 6.9 g/dL (ref 6.0–8.5)

## 2020-02-25 LAB — CBC WITH DIFFERENTIAL/PLATELET
Basophils Absolute: 0 10*3/uL (ref 0.0–0.2)
Basos: 0 %
EOS (ABSOLUTE): 0.2 10*3/uL (ref 0.0–0.4)
Eos: 2 %
Hematocrit: 39.2 % (ref 34.0–46.6)
Hemoglobin: 13.1 g/dL (ref 11.1–15.9)
Immature Grans (Abs): 0 10*3/uL (ref 0.0–0.1)
Immature Granulocytes: 0 %
Lymphocytes Absolute: 2.6 10*3/uL (ref 0.7–3.1)
Lymphs: 38 %
MCH: 28.6 pg (ref 26.6–33.0)
MCHC: 33.4 g/dL (ref 31.5–35.7)
MCV: 86 fL (ref 79–97)
Monocytes Absolute: 0.4 10*3/uL (ref 0.1–0.9)
Monocytes: 5 %
Neutrophils Absolute: 3.8 10*3/uL (ref 1.4–7.0)
Neutrophils: 55 %
Platelets: 247 10*3/uL (ref 150–450)
RBC: 4.58 x10E6/uL (ref 3.77–5.28)
RDW: 13.6 % (ref 11.7–15.4)
WBC: 6.9 10*3/uL (ref 3.4–10.8)

## 2020-03-01 ENCOUNTER — Encounter: Payer: Self-pay | Admitting: Family Medicine

## 2020-03-06 ENCOUNTER — Encounter: Payer: Self-pay | Admitting: Family Medicine

## 2020-03-06 ENCOUNTER — Ambulatory Visit: Payer: Self-pay

## 2020-03-06 ENCOUNTER — Telehealth: Payer: Self-pay | Admitting: Family Medicine

## 2020-03-06 ENCOUNTER — Other Ambulatory Visit: Payer: Self-pay

## 2020-03-06 DIAGNOSIS — R030 Elevated blood-pressure reading, without diagnosis of hypertension: Secondary | ICD-10-CM

## 2020-03-06 NOTE — Telephone Encounter (Signed)
See patient message

## 2020-03-06 NOTE — Progress Notes (Signed)
Patient is concerned because her blood pressure has been elevated recently.  She is under some stress and does not know if it could possibly be coming from that.  She has been monitoring at home and is keeping a log of her readings.  Her readings while in the office today were 158/86, 164/98, and 169/84.  I scheduled her an appointment to see you to discuss this on 03/10/2020 at 1:45 pm.

## 2020-03-10 ENCOUNTER — Ambulatory Visit: Payer: Self-pay | Admitting: Family Medicine

## 2020-03-16 ENCOUNTER — Encounter: Payer: Self-pay | Admitting: Family Medicine

## 2020-03-16 MED ORDER — AMLODIPINE BESYLATE 2.5 MG PO TABS: 2.5000 mg | ORAL_TABLET | Freq: Every day | ORAL | 3 refills | Status: DC

## 2020-03-19 ENCOUNTER — Encounter: Payer: Self-pay | Admitting: Family Medicine

## 2020-03-20 ENCOUNTER — Other Ambulatory Visit: Payer: Self-pay | Admitting: Family Medicine

## 2020-03-20 DIAGNOSIS — R140 Abdominal distension (gaseous): Secondary | ICD-10-CM

## 2020-03-24 ENCOUNTER — Encounter: Payer: Self-pay | Admitting: Internal Medicine

## 2020-03-31 ENCOUNTER — Other Ambulatory Visit: Payer: Self-pay

## 2020-03-31 ENCOUNTER — Ambulatory Visit: Payer: Self-pay | Admitting: Family Medicine

## 2020-03-31 DIAGNOSIS — R030 Elevated blood-pressure reading, without diagnosis of hypertension: Secondary | ICD-10-CM

## 2020-03-31 NOTE — Progress Notes (Signed)
Patient came in today for Blood pressure check with nurse.  BP was taken in left arm  BP- 149/83 P-81  Second check  BP-166/76   P-82   Been keeping a log looks like BP is staying the same at home. Takes the amlodipine 2.5mg  around 5-5:30pm everyday please advise

## 2020-04-13 ENCOUNTER — Encounter: Payer: Self-pay | Admitting: Family Medicine

## 2020-04-22 ENCOUNTER — Ambulatory Visit: Payer: Self-pay | Admitting: Internal Medicine

## 2020-04-23 ENCOUNTER — Ambulatory Visit: Payer: Self-pay | Admitting: Internal Medicine

## 2020-05-26 ENCOUNTER — Ambulatory Visit: Payer: Self-pay | Admitting: Gastroenterology

## 2020-06-24 ENCOUNTER — Encounter: Payer: Self-pay | Admitting: Family Medicine

## 2020-06-24 NOTE — Telephone Encounter (Signed)
Pt scheduled an appt to see Neysa Bonito regarding her toes on Monday 06/29/20 (first available appt slot). Wants to know what Dr Nadine Counts wants her to do in the mean time before her appt?

## 2020-06-29 ENCOUNTER — Ambulatory Visit (INDEPENDENT_AMBULATORY_CARE_PROVIDER_SITE_OTHER): Payer: Self-pay | Admitting: Family

## 2020-06-29 ENCOUNTER — Other Ambulatory Visit: Payer: Self-pay

## 2020-06-29 ENCOUNTER — Encounter: Payer: Self-pay | Admitting: Family

## 2020-06-29 DIAGNOSIS — S92331A Displaced fracture of third metatarsal bone, right foot, initial encounter for closed fracture: Secondary | ICD-10-CM

## 2020-06-29 DIAGNOSIS — W19XXXA Unspecified fall, initial encounter: Secondary | ICD-10-CM

## 2020-06-29 NOTE — Progress Notes (Signed)
   Virtual Visit via telephone Note Due to COVID-19 pandemic this visit was conducted virtually. This visit type was conducted due to national recommendations for restrictions regarding the COVID-19 Pandemic (e.g. social distancing, sheltering in place) in an effort to limit this patient's exposure and mitigate transmission in our community. All issues noted in this document were discussed and addressed.  A physical exam was not performed with this format.  I connected with Vicki Robinson on 06/29/20 at 8:11 AM  by telephone and verified that I am speaking with the correct person using two identifiers. Vicki Robinson is currently located at home and no one is currently with her during visit. The provider, Jannifer Rodney, FNP is located in their office at time of visit.  I discussed the limitations, risks, security and privacy concerns of performing an evaluation and management service by telephone and the availability of in person appointments. I also discussed with the patient that there may be a patient responsible charge related to this service. The patient expressed understanding and agreed to proceed.   History and Present Illness:  HPI Pt calls the office today with right third toe swelling and bruising. She states this happened 06/24/20 ago while walking her dog she fell off a log.   She reports that her third toe is positioned farther away from there second then usual.   She reports her pain is a 1 out 10 when walking, but when bending 2 out 10.    She has ice, buddy taped, soaked epsom salt, and elevated it up at night with mild relief.   Review of Systems  All other systems reviewed and are negative.    Observations/Objective: No SOB or distress note, pictures of toe reviewed bruising and slight swelling noted.   Assessment and Plan: 1. Closed fracture of third metatarsal bone of right foot, physeal involvement unspecified, initial encounter Rest Ice NSAID's as needed  X-ray  pending Call if symptoms worsen or do not improve      I discussed the assessment and treatment plan with the patient. The patient was provided an opportunity to ask questions and all were answered. The patient agreed with the plan and demonstrated an understanding of the instructions.   The patient was advised to call back or seek an in-person evaluation if the symptoms worsen or if the condition fails to improve as anticipated.  The above assessment and management plan was discussed with the patient. The patient verbalized understanding of and has agreed to the management plan. Patient is aware to call the clinic if symptoms persist or worsen. Patient is aware when to return to the clinic for a follow-up visit. Patient educated on when it is appropriate to go to the emergency department.   Time call ended:  8:27 AM   I provided 16 minutes of non-face-to-face time during this encounter.    Jannifer Rodney, FNP

## 2020-07-01 ENCOUNTER — Other Ambulatory Visit: Payer: Self-pay

## 2020-07-01 ENCOUNTER — Other Ambulatory Visit (INDEPENDENT_AMBULATORY_CARE_PROVIDER_SITE_OTHER): Payer: Self-pay

## 2020-07-01 DIAGNOSIS — S92331A Displaced fracture of third metatarsal bone, right foot, initial encounter for closed fracture: Secondary | ICD-10-CM

## 2020-07-15 ENCOUNTER — Encounter: Payer: Self-pay | Admitting: Family Medicine

## 2020-07-16 NOTE — Telephone Encounter (Signed)
Visit with you on 06/29/20. Please advise.  Closed fracture of third metatarsal bone of right foot, physeal involvement unspecified, initial encounter

## 2020-07-17 ENCOUNTER — Other Ambulatory Visit: Payer: Self-pay | Admitting: Family

## 2020-07-17 DIAGNOSIS — S92336D Nondisplaced fracture of third metatarsal bone, unspecified foot, subsequent encounter for fracture with routine healing: Secondary | ICD-10-CM

## 2020-07-31 ENCOUNTER — Encounter: Payer: Self-pay | Admitting: Family Medicine

## 2020-08-05 ENCOUNTER — Encounter: Payer: Self-pay | Admitting: Family Medicine

## 2020-08-13 ENCOUNTER — Ambulatory Visit (INDEPENDENT_AMBULATORY_CARE_PROVIDER_SITE_OTHER): Payer: Self-pay | Admitting: Family Medicine

## 2020-08-13 ENCOUNTER — Encounter: Payer: Self-pay | Admitting: Family Medicine

## 2020-08-13 ENCOUNTER — Other Ambulatory Visit: Payer: Self-pay

## 2020-08-13 VITALS — BP 146/79 | HR 80 | Temp 98.0°F | Ht 63.0 in | Wt 168.8 lb

## 2020-08-13 DIAGNOSIS — L509 Urticaria, unspecified: Secondary | ICD-10-CM

## 2020-08-13 NOTE — Patient Instructions (Signed)
Hives Hives (urticaria) are itchy, red, swollen areas on the skin. Hives can appear on any part of the body. Hives often fade within 24 hours (acute hives). Sometimes, new hives appear after old ones fade and the cycle can continue for several days or weeks (chronic hives). Hives do not spread from person to person (are not contagious). Hives come from the body's reaction to something a person is allergic to (allergen), something that causes irritation, or various other triggers. When a person is exposed to a trigger, his or her body releases a chemical (histamine) that causes redness, itching, and swelling. Hives can appear right after exposure to a trigger or hours later. What are the causes? This condition may be caused by:  Allergies to foods or ingredients.  Insect bites or stings.  Exposure to pollen or pets.  Contact with latex or chemicals.  Spending time in sunlight, heat, or cold (exposure).  Exercise.  Stress.  Certain medicines. You can also get hives from other medical conditions and treatments, such as:  Viruses, including the common cold.  Bacterial infections, such as urinary tract infections and strep throat.  Certain medicines.  Allergy shots.  Blood transfusions. Sometimes, the cause of this condition is not known (idiopathic hives). What increases the risk? You are more likely to develop this condition if you:  Are a woman.  Have food allergies, especially to citrus fruits, milk, eggs, peanuts, tree nuts, or shellfish.  Are allergic to: ? Medicines. ? Latex. ? Insects. ? Animals. ? Pollen. What are the signs or symptoms? Common symptoms of this condition include raised, itchy, red or white bumps or patches on your skin. These areas may:  Become large and swollen (welts).  Change in shape and location, quickly and repeatedly.  Be separate hives or connect over a large area of skin.  Sting or become painful.  Turn white when pressed in the  center (blanch). In severe cases, yourhands, feet, and face may also become swollen. This may occur if hives develop deeper in your skin.   How is this diagnosed? This condition may be diagnosed by your symptoms, medical history, and physical exam.  Your skin, urine, or blood may be tested to find out what is causing your hives and to rule out other health issues.  Your health care provider may also remove a small sample of skin from the affected area and examine it under a microscope (biopsy). How is this treated? Treatment for this condition depends on the cause and severity of your symptoms. Your health care provider may recommend using cool, wet cloths (cool compresses) or taking cool showers to relieve itching. Treatment may include:  Medicines that help: ? Relieve itching (antihistamines). ? Reduce swelling (corticosteroids). ? Treat infection (antibiotics).  An injectable medicine (omalizumab). Your health care provider may prescribe this if you have chronic idiopathic hives and you continue to have symptoms even after treatment with antihistamines. Severe cases may require an emergency injection of adrenaline (epinephrine) to prevent a life-threatening allergic reaction (anaphylaxis). Follow these instructions at home: Medicines  Take and apply over-the-counter and prescription medicines only as told by your health care provider.  If you were prescribed an antibiotic medicine, take it as told by your health care provider. Do not stop using the antibiotic even if you start to feel better. Skin care  Apply cool compresses to the affected areas.  Do not scratch or rub your skin. General instructions  Do not take hot showers or baths. This can   make itching worse.  Do not wear tight-fitting clothing.  Use sunscreen and wear protective clothing when you are outside.  Avoid any substances that cause your hives. Keep a journal to help track what causes your hives. Write  down: ? What medicines you take. ? What you eat and drink. ? What products you use on your skin.  Keep all follow-up visits as told by your health care provider. This is important. Contact a health care provider if:  Your symptoms are not controlled with medicine.  Your joints are painful or swollen. Get help right away if:  You have a fever.  You have pain in your abdomen.  Your tongue or lips are swollen.  Your eyelids are swollen.  Your chest or throat feels tight.  You have trouble breathing or swallowing. These symptoms may represent a serious problem that is an emergency. Do not wait to see if the symptoms will go away. Get medical help right away. Call your local emergency services (911 in the [place] Do not drive yourself to the hospital. Summary  Hives (urticaria) are itchy, red, swollen areas on your skin. Hives come from the body's reaction to something a person is allergic to (allergen), something that causes irritation, or various other triggers.  Treatment for this condition depends on the cause and severity of your symptoms.  Avoid any substances that cause your hives. Keep a journal to help track what causes your hives.  Take and apply over-the-counter and prescription medicines only as told by your health care provider.  Keep all follow-up visits as told by your health care provider. This is important. This information is not intended to replace advice given to you by your health care provider. Make sure you discuss any questions you have with your health care provider. Document Revised: 12/13/2017 Document Reviewed: 12/13/2017 Elsevier Patient Education  2021 [employer]vier Inc.  

## 2020-08-13 NOTE — Progress Notes (Signed)
Acute Office Visit  Subjective:    Patient ID: Vicki Robinson, female    DOB: 11-Nov-1965, 55 y.o.   MRN: [id number]  Chief Complaint  Patient presents with  . Rash  . Urticaria    HPI Patient is in today for a rash that has been occurring off and on for a year. . The rash comes and goes. It is itchy, red, and bumpy. She often has itching then the rash will appear. She takes zyrtec sometimes for itching and this seems to help. Sometimes she takes benadryl with good relief. She denies changes in detergent, products, or foods. She is currently having some itching where the BP cuff was.   She has had some elevated BPs recently but she hasn't taken her amlodipine daily. She reports that her BP is normally well controlled when she takes her amlodipine daily.   History reviewed. No pertinent past medical history.  Past Surgical History:  Procedure Laterality Date  . TUBAL LIGATION      Family History  Problem Relation Age of Onset  . Arthritis Mother   . Depression Mother   . Hypertension Mother   . Miscarriages / [place] Mother   . Stroke Mother   . Arthritis Brother   . COPD Brother   . Hearing loss Brother   . Cancer Maternal Grandmother     Social History   Socioeconomic History  . Marital status: Married    Spouse name: Not on file  . Number of children: Not on file  . Years of education: Not on file  . Highest education level: Not on file  Occupational History  . Not on file  Tobacco Use  . Smoking status: Current Every Day Smoker    Packs/day: 0.25    Types: Cigarettes  . Smokeless tobacco: Never Used  Substance and Sexual Activity  . Alcohol use: No  . Drug use: No  . Sexual activity: Not on file  Other Topics Concern  . Not on file  Social History Narrative  . Not on file   Social Determinants of Health   Financial Resource Strain: Not on file  Food Insecurity: Not on file  Transportation Needs: Not on file  Physical Activity: Not on file   Stress: Not on file  Social Connections: Not on file  Intimate Partner Violence: Not on file    Outpatient Medications Prior to Visit  Medication Sig Dispense Refill  . amLODipine (NORVASC) 2.5 MG tablet Take 1 tablet (2.5 mg total) by mouth daily. 90 tablet 3  . Aspirin-Salicylamide-Caffeine (BC HEADACHE POWDER PO) Take by mouth daily.    . polyethylene glycol (MIRALAX / GLYCOLAX) 17 g packet Take 17 g by mouth daily. As needed    . Sennosides-Docusate Sodium (SENNA-DOCUSATE SODIUM PO) Take by mouth. As needed    . diclofenac (VOLTAREN) 75 MG EC tablet Take 1 tablet (75 mg total) by mouth 2 (two) times daily as needed. (Patient not taking: Reported on 08/13/2020) 30 tablet 0  . Docusate Sodium 100 MG capsule Take 100 mg by mouth 2 (two) times daily. (Patient not taking: Reported on 08/13/2020)     No facility-administered medications prior to visit.    Allergies  Allergen Reactions  . Penicillins Hives    Review of Systems Negative unless specially indicated above in HPI.     Objective:    Physical Exam Vitals and nursing note reviewed.  Constitutional:      General: She is not in acute distress.  Appearance: Normal appearance. She is not ill-appearing, toxic-appearing or diaphoretic.  HENT:     Head: Normocephalic and atraumatic.  Eyes:     Extraocular Movements: Extraocular movements intact.     Conjunctiva/sclera: Conjunctivae normal.     Pupils: Pupils are equal, round, and reactive to light.  Cardiovascular:     Rate and Rhythm: Normal rate and regular rhythm.     Heart sounds: Normal heart sounds. No murmur heard.   Pulmonary:     Effort: Pulmonary effort is normal. No respiratory distress.     Breath sounds: Normal breath sounds.  Musculoskeletal:     Right lower leg: No edema.     Left lower leg: No edema.  Skin:    General: Skin is warm and dry.     Findings: Rash (rash consistent with hives present on right upper arm where BP cuff was located) present.   Neurological:     General: No focal deficit present.     Mental Status: She is alert and oriented to person, place, and time.  Psychiatric:        Mood and Affect: Mood normal.        Behavior: Behavior normal.        Thought Content: Thought content normal.        Judgment: Judgment normal.     Ht 5\' 3"  (1.6 m)   Wt 168 lb 12.8 oz (76.6 kg)   BMI 29.90 kg/m  Wt Readings from Last 3 Encounters:  08/13/20 168 lb 12.8 oz (76.6 kg)  02/24/20 173 lb (78.5 kg)  10/16/17 163 lb (73.9 kg)    Health Maintenance Due  Topic Date Due  . DEXA SCAN  Never done  . Hepatitis C Screening  Never done  . COVID-19 Vaccine (1) Never done  . HIV Screening  Never done  . TETANUS/TDAP  Never done  . PAP SMEAR-Modifier  Never done  . COLONOSCOPY (Pts 45-33yrs Insurance coverage will need to be confirmed)  Never done  . MAMMOGRAM  02/19/2017    There are no preventive care reminders to display for this patient.   No results found for: TSH Lab Results  Component Value Date   WBC 6.9 02/24/2020   HGB 13.1 02/24/2020   HCT 39.2 02/24/2020   MCV 86 02/24/2020   PLT 247 02/24/2020   Lab Results  Component Value Date   NA 140 02/24/2020   K 4.2 02/24/2020   CO2 20 02/24/2020   GLUCOSE 92 02/24/2020   BUN 9 02/24/2020   CREATININE 0.77 02/24/2020   BILITOT <0.2 02/24/2020   ALKPHOS 114 02/24/2020   AST 19 02/24/2020   ALT 15 02/24/2020   PROT 6.9 02/24/2020   ALBUMIN 4.1 02/24/2020   CALCIUM 8.8 02/24/2020   No results found for: CHOL No results found for: HDL No results found for: LDLCALC No results found for: TRIG No results found for: CHOLHDL No results found for: 02/26/2020     Assessment & Plan:   Vicki Robinson was seen today for rash and urticaria.  Diagnoses and all orders for this visit:  Hives Unknown cause. Patient is self pay and reports she would not be able to pay for allergy testing. Take zyrtec daily. Benadryl and hydrocortisone cream as needed. Handout given. Return  to office for new or worsening symptoms, or if symptoms persist.    The patient indicates understanding of these issues and agrees with the plan.   Byrd Hesselbach, FNP

## 2020-08-17 ENCOUNTER — Encounter: Payer: Self-pay | Admitting: Family Medicine

## 2020-08-17 DIAGNOSIS — K921 Melena: Secondary | ICD-10-CM

## 2020-08-18 ENCOUNTER — Encounter (INDEPENDENT_AMBULATORY_CARE_PROVIDER_SITE_OTHER): Payer: Self-pay | Admitting: *Deleted

## 2020-09-01 ENCOUNTER — Other Ambulatory Visit: Payer: Self-pay | Admitting: Family

## 2020-10-13 ENCOUNTER — Ambulatory Visit (INDEPENDENT_AMBULATORY_CARE_PROVIDER_SITE_OTHER): Payer: Self-pay | Admitting: Gastroenterology

## 2020-10-30 ENCOUNTER — Encounter: Payer: Self-pay | Admitting: Family Medicine

## 2020-10-30 ENCOUNTER — Telehealth: Payer: Self-pay | Admitting: Physician Assistant

## 2020-10-30 DIAGNOSIS — J208 Acute bronchitis due to other specified organisms: Secondary | ICD-10-CM

## 2020-10-30 DIAGNOSIS — B9689 Other specified bacterial agents as the cause of diseases classified elsewhere: Secondary | ICD-10-CM

## 2020-10-30 MED ORDER — BENZONATATE 100 MG PO CAPS: 100.0000 mg | ORAL_CAPSULE | Freq: Three times a day (TID) | ORAL | 0 refills | Status: DC | PRN

## 2020-10-30 MED ORDER — AZITHROMYCIN 250 MG PO TABS: ORAL_TABLET | ORAL | 0 refills | Status: DC

## 2020-10-30 MED ORDER — PREDNISONE 10 MG (21) PO TBPK: ORAL_TABLET | ORAL | 0 refills | Status: DC

## 2020-10-30 NOTE — Progress Notes (Signed)
We are sorry that you are not feeling well.  Here is how we plan to help!  Based on your presentation I believe you most likely have A cough due to bacteria.  When patients have a fever and a productive cough with a change in color or increased sputum production, we are concerned about bacterial bronchitis.  If left untreated it can progress to pneumonia.  If your symptoms do not improve with your treatment plan it is important that you contact your provider.   I have prescribed Azithromyin 250 mg: two tablets now and then one tablet daily for 4 additonal days    In addition you may use A prescription cough medication called Tessalon Perles 100mg. You may take 1-2 capsules every 8 hours as needed for your cough.  Prednisone 10 mg daily for 6 days (see taper instructions below)  Directions for 6 day taper: Day 1: 2 tablets before breakfast, 1 after both lunch & dinner and 2 at bedtime Day 2: 1 tab before breakfast, 1 after both lunch & dinner and 2 at bedtime Day 3: 1 tab at each meal & 1 at bedtime Day 4: 1 tab at breakfast, 1 at lunch, 1 at bedtime Day 5: 1 tab at breakfast & 1 tab at bedtime Day 6: 1 tab at breakfast   From your responses in the eVisit questionnaire you describe inflammation in the upper respiratory tract which is causing a significant cough.  This is commonly called Bronchitis and has four common causes:    Allergies  Viral Infections  Acid Reflux  Bacterial Infection Allergies, viruses and acid reflux are treated by controlling symptoms or eliminating the cause. An example might be a cough caused by taking certain blood pressure medications. You stop the cough by changing the medication. Another example might be a cough caused by acid reflux. Controlling the reflux helps control the cough.  USE OF BRONCHODILATOR ("RESCUE") INHALERS: There is a risk from using your bronchodilator too frequently.  The risk is that over-reliance on a medication which only relaxes the  muscles surrounding the breathing tubes can reduce the effectiveness of medications prescribed to reduce swelling and congestion of the tubes themselves.  Although you feel brief relief from the bronchodilator inhaler, your asthma may actually be worsening with the tubes becoming more swollen and filled with mucus.  This can delay other crucial treatments, such as oral steroid medications. If you need to use a bronchodilator inhaler daily, several times per day, you should discuss this with your provider.  There are probably better treatments that could be used to keep your asthma under control.     HOME CARE . Only take medications as instructed by your medical team. . Complete the entire course of an antibiotic. . Drink plenty of fluids and get plenty of rest. . Avoid close contacts especially the very young and the elderly . Cover your mouth if you cough or cough into your sleeve. . Always remember to wash your hands . A steam or ultrasonic humidifier can help congestion.   GET HELP RIGHT AWAY IF: . You develop worsening fever. . You become short of breath . You cough up blood. . Your symptoms persist after you have completed your treatment plan MAKE SURE YOU   Understand these instructions.  Will watch your condition.  Will get help right away if you are not doing well or get worse.  Your e-visit answers were reviewed by a board certified advanced clinical practitioner to complete your   personal care plan.  Depending on the condition, your plan could have included both over the counter or prescription medications. If there is a problem please reply  once you have received a response from your provider. Your safety is important to us.  If you have drug allergies check your prescription carefully.    You can use MyChart to ask questions about today's visit, request a non-urgent call back, or ask for a work or school excuse for 24 hours related to this e-Visit. If it has been greater than  24 hours you will need to follow up with your provider, or enter a new e-Visit to address those concerns. You will get an e-mail in the next two days asking about your experience.  I hope that your e-visit has been valuable and will speed your recovery. Thank you for using e-visits.  I provided 5 minutes of non face-to-face time during this encounter for chart review and documentation.  

## 2020-12-08 ENCOUNTER — Other Ambulatory Visit (HOSPITAL_COMMUNITY): Payer: Self-pay | Admitting: Obstetrics and Gynecology

## 2020-12-08 ENCOUNTER — Ambulatory Visit (INDEPENDENT_AMBULATORY_CARE_PROVIDER_SITE_OTHER): Payer: Self-pay | Admitting: Internal Medicine

## 2020-12-08 DIAGNOSIS — Z1231 Encounter for screening mammogram for malignant neoplasm of breast: Secondary | ICD-10-CM

## 2020-12-18 ENCOUNTER — Ambulatory Visit (HOSPITAL_COMMUNITY)
Admission: RE | Admit: 2020-12-18 | Discharge: 2020-12-18 | Disposition: A | Discharge status: Home / Self Care | Payer: Self-pay | Source: Ambulatory Visit | Attending: Obstetrics and Gynecology | Admitting: Obstetrics and Gynecology

## 2020-12-18 ENCOUNTER — Other Ambulatory Visit: Payer: Self-pay

## 2020-12-18 ENCOUNTER — Ambulatory Visit: Payer: Self-pay | Admitting: *Deleted

## 2020-12-18 VITALS — BP 145/73

## 2020-12-18 DIAGNOSIS — Z1231 Encounter for screening mammogram for malignant neoplasm of breast: Secondary | ICD-10-CM | POA: Insufficient documentation

## 2020-12-18 DIAGNOSIS — Z01419 Encounter for gynecological examination (general) (routine) without abnormal findings: Secondary | ICD-10-CM

## 2020-12-18 DIAGNOSIS — Z1211 Encounter for screening for malignant neoplasm of colon: Secondary | ICD-10-CM

## 2020-12-18 NOTE — Patient Instructions (Signed)
Explained breast self awareness with Rick Duff. Pap smear completed today. Let her know [facility] will cover Pap smears and HPV typing every 5 years unless has a history of abnormal Pap smears. Referred patient to [facility] for a screening mammogram. Appointment scheduled Friday, December 18, 2020 at 1145. Patient escorted to mammography following [facility] appointment for her screening mammogram. Let patient know will follow up with her within the next couple weeks with results of her Pap smear by phone. Informed patient that [facility] will follow up with her within the next couple of weeks with results of her mammogram by letter or phone. Discussed smoking cessation with patient. Referred to the [place] quitline and gave resources to the free smoking cessation classes at [facility]. verbalized understanding.  Chenise Mulvihill, Kathaleen Maser, RN 11:57 AM

## 2020-12-18 NOTE — Progress Notes (Signed)
Vicki Robinson is a 55 y.o. G1P0 female who presents to [facility] clinic today with no complaints.    Pap Smear: Pap smear completed today. Last Pap smear was over 3 years ago at the [facility] clinic and was normal. Per patient has history of an abnormal Pap smear 30 years ago that a LEEP was completed for follow-up. Per patient all Pap smears have been normal and she has had at least three normal Pap smears since LEEP. Last Pap smear result is not available in Epic.   Physical exam: Breasts Breasts symmetrical. No skin abnormalities bilateral breasts. No nipple retraction bilateral breasts. No nipple discharge bilateral breasts. No lymphadenopathy. No lumps palpated bilateral breasts. No complaints of pain or tenderness on exam.     Pelvic/Bimanual Ext Genitalia No lesions, no swelling and no discharge observed on external genitalia.        Vagina Vagina pink and normal texture. No lesions or discharge observed in vagina.        Cervix Cervix is present. Cervix pink and of normal texture. No discharge observed.    Uterus Uterus is present and palpable. Uterus in normal position and normal size.        Adnexae Bilateral ovaries present and palpable. No tenderness on palpation.         Rectovaginal No rectal exam completed today since patient had no rectal complaints. No skin abnormalities observed on exam.     Smoking History: Patient is a current smoker. Discussed smoking cessation with patient. Referred to the [place] quitline and gave resources to the free smoking cessation classes at [facility].   Patient Navigation: Patient education provided. Access to services provided for patient through [facility] program.   Colorectal Cancer Screening: Per patient has never had colonoscopy completed. FIT Test given to patient to complete. No complaints today.    Breast and Cervical Cancer Risk Assessment: Patient does not have family history of breast cancer, known  genetic mutations, or radiation treatment to the chest before age 62. Per patient has history of cervical dysplasia. Patient has no history of being immunocompromised or DES exposure in-utero.  Risk Assessment     Risk Scores       12/18/2020   Last edited by: [name], CMA   5-year risk: 0.9 %   Lifetime risk: 5.9 %            A: [facility] exam with pap smear No complaints.  P: Referred patient to [facility] for a screening mammogram. Appointment scheduled Friday, December 18, 2020 at 1145.  Priscille Heidelberg, RN 12/18/2020 11:57 AM

## 2020-12-22 ENCOUNTER — Telehealth: Payer: Self-pay

## 2020-12-22 LAB — CYTOLOGY - PAP
Comment: NEGATIVE
Diagnosis: NEGATIVE
High risk HPV: NEGATIVE

## 2020-12-22 NOTE — Telephone Encounter (Signed)
Called patient to give pap smear results. Informed patient that pap smear was normal and HPV was negative. Based on this result her next pap smear will be due in 3-5 years. Patient voiced understanding.  

## 2020-12-26 LAB — FECAL OCCULT BLOOD, IMMUNOCHEMICAL: Fecal Occult Bld: NEGATIVE

## 2020-12-27 ENCOUNTER — Encounter: Payer: Self-pay | Admitting: Family Medicine

## 2020-12-28 ENCOUNTER — Telehealth: Payer: Self-pay

## 2020-12-28 MED ORDER — AMLODIPINE BESYLATE 2.5 MG PO TABS: 2.5000 mg | ORAL_TABLET | Freq: Every day | ORAL | 0 refills | Status: DC

## 2020-12-28 NOTE — Telephone Encounter (Signed)
Error

## 2020-12-28 NOTE — Telephone Encounter (Addendum)
Patient returned call, informed negative FIT Test results. Patient verbalized understanding.    Attempted to contact patient regarding lab results (FIT Test) results. Left message on voicemail requesting a returning call.

## 2021-02-16 ENCOUNTER — Ambulatory Visit: Payer: Self-pay | Admitting: Family Medicine

## 2021-02-28 ENCOUNTER — Encounter: Payer: Self-pay | Admitting: Family Medicine

## 2021-03-01 ENCOUNTER — Ambulatory Visit: Payer: Self-pay | Admitting: Family Medicine

## 2021-03-02 ENCOUNTER — Encounter: Payer: Self-pay | Admitting: Family Medicine

## 2021-03-19 ENCOUNTER — Encounter: Payer: Self-pay | Admitting: Family Medicine

## 2021-03-29 ENCOUNTER — Encounter: Payer: Self-pay | Admitting: Family Medicine

## 2021-03-29 ENCOUNTER — Other Ambulatory Visit: Payer: Self-pay

## 2021-03-29 ENCOUNTER — Ambulatory Visit: Payer: Self-pay | Admitting: Family Medicine

## 2021-03-29 VITALS — BP 165/76 | HR 67 | Temp 97.6°F | Ht 63.0 in | Wt 156.8 lb

## 2021-03-29 DIAGNOSIS — R634 Abnormal weight loss: Secondary | ICD-10-CM

## 2021-03-29 DIAGNOSIS — I10 Essential (primary) hypertension: Secondary | ICD-10-CM

## 2021-03-29 DIAGNOSIS — Z720 Tobacco use: Secondary | ICD-10-CM

## 2021-03-29 NOTE — Patient Instructions (Signed)
You had labs performed today.  You will be contacted with the results of the labs once they are available, usually in the next 3 business days for routine lab work.  If you have an active my chart account, they will be released to your MyChart.  If you prefer to have these labs released to you via telephone, please let us know.  If you had a pap smear or biopsy performed, expect to be contacted in about 7-10 days.  Tobacco Use Disorder Tobacco use disorder (TUD) occurs when a person craves, seeks, and uses tobacco, regardless of the consequences. This disorder can cause problems with mental and physical health. It can affect your ability to have healthy relationships, and it can keep you from meeting your responsibilities at work, home, or school. Tobacco may be: Smoked as a cigarette or cigar. Inhaled using e-cigarettes. Smoked in a pipe or hookah. Chewed as smokeless tobacco. Inhaled into the nostrils as snuff. Tobacco products contain a dangerous chemical called nicotine, which is very addictive. Nicotine triggers hormones that make the body feel stimulated and works on areas of the brain that make you feel good. These effects can make it hard for people to quit nicotine. Tobacco contains many other unsafe chemicals that can damage almost every organ in the body. Smoking tobacco also puts others in danger due to fire risk and possible health problems caused by breathing in secondhand smoke. What are the signs or symptoms? Symptoms of TUD may include: Being unable to slow down or stop your tobacco use. Spending an abnormal amount of time getting or using tobacco. Craving tobacco. Cravings may last for up to 6 months after quitting. Tobacco use that: Interferes with your work, school, or home life. Interferes with your personal and social relationships. Makes you give up activities that you once enjoyed or found important. Using tobacco even though you know that it is: Dangerous or bad for  your health or someone else's health. Causing problems in your life. Needing more and more of the substance to get the same effect (developing tolerance). Experiencing unpleasant symptoms if you do not use the substance (withdrawal). Withdrawal symptoms may include: Depressed, anxious, or irritable mood. Difficulty concentrating. Increased appetite. Restlessness or trouble sleeping. Using the substance to avoid withdrawal. How is this diagnosed? This condition may be diagnosed based on: Your current and past tobacco use. Your health care provider may ask questions about how your tobacco use affects your life. A physical exam. You may be diagnosed with TUD if you have at least two symptoms within a 12-month period. How is this treated? This condition is treated by stopping tobacco use. Many people are unable to quit on their own and need help. Treatment may include: Nicotine replacement therapy (NRT). NRT provides nicotine without the other harmful chemicals in tobacco. NRT gradually lowers the dosage of nicotine in the body and reduces withdrawal symptoms. NRT is available as: Over-the-counter gums, lozenges, and skin patches. Prescription mouth inhalers and nasal sprays. Medicine that acts on the brain to reduce cravings and withdrawal symptoms. A type of talk therapy that examines your triggers for tobacco use, how to avoid them, and how to cope with cravings (behavioral therapy). Hypnosis. This may help with withdrawal symptoms. Joining a support group for others coping with TUD. The best treatment for TUD is usually a combination of medicine, talk therapy, and support groups. Recovery can be a long process. Many people start using tobacco again after stopping (relapse). If you relapse, it   does not mean that treatment will not work. Follow these instructions at home: Lifestyle Do not use any products that contain nicotine or tobacco, such as cigarettes and e-cigarettes. Avoid things  that trigger tobacco use as much as you can. Triggers include people and situations that usually cause you to use tobacco. Avoid drinks that contain caffeine, including coffee. These may worsen some withdrawal symptoms. Find ways to manage stress. Wanting to smoke may cause stress, and stress can make you want to smoke. Relaxation techniques such as deep breathing, meditation, and yoga may help. Attend support groups as needed. These groups are an important part of long-term recovery for many people. General instructions Take over-the-counter and prescription medicines only as told by your health care provider. Check with your health care provider before taking any new prescription or over-the-counter medicines. Decide on a friend, family member, or smoking quit-line (such as 1-800-QUIT-NOW in the [place] that you can call or text when you feel the urge to smoke or when you need help coping with cravings. Keep all follow-up visits as told by your health care provider and therapist. This is important. Contact a health care provider if: You are not able to take your medicines as prescribed. Your symptoms get worse, even with treatment. Summary Tobacco use disorder (TUD) occurs when a person craves, seeks, and uses tobacco regardless of the consequences. This condition may be diagnosed based on your current and past tobacco use and a physical exam. Many people are unable to quit on their own and need help. Recovery can be a long process. The most effective treatment for TUD is usually a combination of medicine, talk therapy, and support groups. This information is not intended to replace advice given to you by your health care provider. Make sure you discuss any questions you have with your health care provider. Document Revised: 04/21/2020 Document Reviewed: 04/21/2020 Elsevier Patient Education  2022 [employer] Inc[employer]   

## 2021-03-29 NOTE — Progress Notes (Signed)
Subjective: CC: Hypertension PCP: Janora Norlander, DO Vicki Robinson is a 55 y.o. female presenting to clinic today for:  1. Hypertension, tobacco use disorder; weight loss Patient here for follow-up on chronic hypertension.  She had elevations in blood pressure checks both at home and on her office visit with gynecology back in July.  She was started on Norvasc 2.5 mg daily back in 2021 after she was found to have elevated blood pressure checks with nurse.  Our last office visit was May 2019 and at that time her blood pressure was normal.  She notes that she intermittently checks blood pressure at home and the blood pressures have ranged in the 130s over 70s.  She had subsequently weaned herself off of the Norvasc.  No reports of headache, swelling, blurred vision, chest pain or shortness of breath.  She is an active every day smoker of 7 cigarettes/day and has been a smoker for over 40 years.  Her spouse also smokes.  She reports to me that one of her biggest fears is developing cancer.  She has had some weight loss over the last several months.  She admits that she only eats about 1200 cal/day and is active with chickens at her home.  She works 2 days/week at [employer].  No reports of night sweats, hemoptysis, voice changes, GI bleeding, change in exercise tolerance or breathing.  Her Pap smear, mammogram, FIT testing have all been negative.   ROS: Per HPI  Allergies  Allergen Reactions   Penicillins Hives   Past Medical History:  Diagnosis Date   Hypertension     Current Outpatient Medications:    Aspirin-Salicylamide-Caffeine (BC HEADACHE POWDER PO), Take by mouth daily., Disp: , Rfl:    polyethylene glycol (MIRALAX / GLYCOLAX) 17 g packet, Take 17 g by mouth daily. As needed, Disp: , Rfl:    Sennosides-Docusate Sodium (SENNA-DOCUSATE SODIUM PO), Take by mouth. As needed, Disp: , Rfl:    amLODipine (NORVASC) 2.5 MG tablet, Take 1 tablet (2.5 mg total) by mouth daily. (NEEDS TO  BE SEEN BEFORE NEXT REFILL) (Patient not taking: Reported on 03/29/2021), Disp: 30 tablet, Rfl: 0 Social History   Socioeconomic History   Marital status: Married    Spouse name: Not on file   Number of children: Not on file   Years of education: Not on file   Highest education level: Not on file  Occupational History   Not on file  Tobacco Use   Smoking status: Every Day    Packs/day: 0.25    Types: Cigarettes   Smokeless tobacco: Never   Tobacco comments:    7-8 cigs per day  Vaping Use   Vaping Use: Never used  Substance and Sexual Activity   Alcohol use: Yes    Comment: occ.   Drug use: No   Sexual activity: Yes    Birth control/protection: Post-menopausal, Surgical  Other Topics Concern   Not on file  Social History Narrative   Not on file   Social Determinants of Health   Financial Resource Strain: Not on file  Food Insecurity: No Food Insecurity   Worried About Running Out of Food in the Last Year: Never true   Ran Out of Food in the Last Year: Never true  Transportation Needs: No Transportation Needs   Lack of Transportation (Medical): No   Lack of Transportation (Non-Medical): No  Physical Activity: Not on file  Stress: Not on file  Social Connections: Not on file  Intimate  Partner Violence: Not on file   Family History  Problem Relation Age of Onset   Arthritis Mother    Depression Mother    Hypertension Mother    Miscarriages / [place] Mother    Stroke Mother    Arthritis Brother    COPD Brother    Hearing loss Brother    Cancer Maternal Grandmother     Objective: Office vital signs reviewed. BP (!) 165/76   Pulse 67   Temp 97.6 F (36.4 C)   Ht 5' 3"  (1.6 m)   Wt 156 lb 12.8 oz (71.1 kg)   SpO2 96%   BMI 27.78 kg/m   Physical Examination:  General: Awake, alert, nontoxic appearing female, No acute distress HEENT: Normal; TMs intact bilaterally with normal light reflex.  Oropharynx without masses or erythema.  No sublingual masses  appreciated.  No anterior or posterior cervical lymphadenopathy.  No supraclavicular lymphadenopathy. Cardio: regular rate and rhythm, S1S2 heard, no murmurs appreciated Pulm: clear to auscultation bilaterally, no wheezes, rhonchi or rales; normal work of breathing on room air GI: soft, non-tender, non-distended, bowel sounds present x4, no hepatomegaly, no splenomegaly, no masses Extremities: warm, well perfused, No edema, cyanosis or clubbing; +2 pulses bilaterally MSK: Normal gait and station  Assessment/ Plan: 55 y.o. female   Essential hypertension - Plan: CMP14+EGFR, Lipid Panel  Weight loss - Plan: CMP14+EGFR, TSH, CBC  Tobacco use - Plan: CBC  Blood pressure is not at goal.  I am asking that she brings in her blood pressure cuff to the nurse and have this checked against our blood pressure cuff.  If the measurements are equivalent, we can depend on the normotensive blood pressure she has been seeing at home if they are not equivalent I would like her to go back on the Norvasc 2.5 mg and return for blood pressure check with nurse after 2 weeks of being on that medication again.  Nonfasting CMP and lipid panel ordered  I believe her weight loss simply to be secondary to low caloric intake and increase physical activity.  She is had negative cancer screening tests to date.  Her risk factor is of course ongoing smoking.  She however does not qualify for low-dose CT lung cancer screening.  Since March she has had about a 10 pound weight loss but again she has been trying to make some modifications as she has been motivated to lose weight for many years.  If she develops any worsening symptoms or signs or ongoing unplanned weight loss, low threshold to perform CAT scan to look for lung lesions.  In the interim, I have ordered metabolic labs including thyroid, CBC, CMP to look for any other possible causes.  No orders of the defined types were placed in this encounter.  No orders of the  defined types were placed in this encounter.    Janora Norlander, DO [place] [phone]

## 2021-03-30 ENCOUNTER — Encounter: Payer: Self-pay | Admitting: Family Medicine

## 2021-03-30 LAB — CBC
Hematocrit: 41.7 % (ref 34.0–46.6)
Hemoglobin: 13.8 g/dL (ref 11.1–15.9)
MCH: 28.8 pg (ref 26.6–33.0)
MCHC: 33.1 g/dL (ref 31.5–35.7)
MCV: 87 fL (ref 79–97)
Platelets: 312 10*3/uL (ref 150–450)
RBC: 4.79 x10E6/uL (ref 3.77–5.28)
RDW: 13.5 % (ref 11.7–15.4)
WBC: 10.1 10*3/uL (ref 3.4–10.8)

## 2021-03-30 LAB — CMP14+EGFR
ALT: 16 IU/L (ref 0–32)
AST: 22 IU/L (ref 0–40)
Albumin/Globulin Ratio: 1.8 (ref 1.2–2.2)
Albumin: 4.4 g/dL (ref 3.8–4.9)
Alkaline Phosphatase: 120 IU/L (ref 44–121)
BUN/Creatinine Ratio: 16 (ref 9–23)
BUN: 14 mg/dL (ref 6–24)
Bilirubin Total: <0.2 mg/dL (ref 0.0–1.2)
CO2: 23 mmol/L (ref 20–29)
Calcium: 9.5 mg/dL (ref 8.7–10.2)
Chloride: 101 mmol/L (ref 96–106)
Creatinine, Ser: 0.86 mg/dL (ref 0.57–1.00)
Globulin, Total: 2.4 g/dL (ref 1.5–4.5)
Glucose: 119 mg/dL — ABNORMAL HIGH (ref 70–99)
Potassium: 4.6 mmol/L (ref 3.5–5.2)
Sodium: 139 mmol/L (ref 134–144)
Total Protein: 6.8 g/dL (ref 6.0–8.5)
eGFR: 79 mL/min/{1.73_m2} (ref >59)

## 2021-03-30 LAB — LIPID PANEL
Chol/HDL Ratio: 2.9 ratio (ref 0.0–4.4)
Cholesterol, Total: 213 mg/dL — ABNORMAL HIGH (ref 100–199)
HDL: 73 mg/dL (ref >39)
LDL Chol Calc (NIH): 123 mg/dL — ABNORMAL HIGH (ref 0–99)
Triglycerides: 98 mg/dL (ref 0–149)
VLDL Cholesterol Cal: 17 mg/dL (ref 5–40)

## 2021-03-30 LAB — TSH: TSH: 0.643 u[IU]/mL (ref 0.450–4.500)

## 2021-04-02 ENCOUNTER — Encounter: Payer: Self-pay | Admitting: Family Medicine

## 2021-04-05 ENCOUNTER — Encounter: Payer: Self-pay | Admitting: Family Medicine

## 2021-04-06 NOTE — Telephone Encounter (Signed)
If she has an issue that was not discussed at her physical, she needs a visit.  Televisit ok but she'll need something.

## 2021-04-07 ENCOUNTER — Other Ambulatory Visit: Payer: Self-pay

## 2021-04-07 DIAGNOSIS — R739 Hyperglycemia, unspecified: Secondary | ICD-10-CM

## 2021-04-07 DIAGNOSIS — Z720 Tobacco use: Secondary | ICD-10-CM

## 2021-04-08 ENCOUNTER — Encounter: Payer: Self-pay | Admitting: *Deleted

## 2021-04-09 ENCOUNTER — Encounter: Payer: Self-pay | Admitting: Family Medicine

## 2021-04-09 ENCOUNTER — Other Ambulatory Visit: Payer: Self-pay

## 2021-04-09 ENCOUNTER — Ambulatory Visit: Payer: Self-pay

## 2021-04-09 VITALS — BP 153/76 | HR 76

## 2021-04-09 DIAGNOSIS — I10 Essential (primary) hypertension: Secondary | ICD-10-CM

## 2021-04-09 DIAGNOSIS — R739 Hyperglycemia, unspecified: Secondary | ICD-10-CM

## 2021-04-09 LAB — BAYER DCA HB A1C WAIVED: HB A1C (BAYER DCA - WAIVED): 5.6 % (ref 4.8–5.6)

## 2021-04-09 NOTE — Progress Notes (Signed)
Pt come in office today with a bp of 163/75 pulse 76. We checked her bp on her machine and it was 153/76. I let her sit there for a few minutes and rechecked it I got 143/75.

## 2021-04-12 ENCOUNTER — Other Ambulatory Visit: Payer: Self-pay | Admitting: Family Medicine

## 2021-04-12 DIAGNOSIS — I10 Essential (primary) hypertension: Secondary | ICD-10-CM

## 2021-04-12 MED ORDER — AMLODIPINE BESYLATE 5 MG PO TABS: 5.0000 mg | ORAL_TABLET | Freq: Every day | ORAL | 3 refills | Status: DC

## 2021-04-13 ENCOUNTER — Other Ambulatory Visit: Payer: Self-pay | Admitting: *Deleted

## 2021-04-13 MED ORDER — AMLODIPINE BESYLATE 2.5 MG PO TABS: 2.5000 mg | ORAL_TABLET | Freq: Every day | ORAL | 4 refills | Status: DC

## 2021-04-13 NOTE — Telephone Encounter (Signed)
Ok to send in the 2.5mg  of Norvasc #90w/4 RF and cancel 5mg 

## 2021-05-19 ENCOUNTER — Encounter: Payer: Self-pay | Admitting: Nurse Practitioner

## 2021-05-19 ENCOUNTER — Ambulatory Visit (INDEPENDENT_AMBULATORY_CARE_PROVIDER_SITE_OTHER): Payer: Self-pay

## 2021-05-19 ENCOUNTER — Ambulatory Visit (INDEPENDENT_AMBULATORY_CARE_PROVIDER_SITE_OTHER): Payer: Self-pay | Admitting: Nurse Practitioner

## 2021-05-19 ENCOUNTER — Encounter: Payer: Self-pay | Admitting: Family Medicine

## 2021-05-19 VITALS — BP 120/77 | HR 98 | Temp 97.2°F | Ht 63.0 in | Wt 150.8 lb

## 2021-05-19 DIAGNOSIS — R110 Nausea: Secondary | ICD-10-CM

## 2021-05-19 DIAGNOSIS — R197 Diarrhea, unspecified: Secondary | ICD-10-CM

## 2021-05-19 DIAGNOSIS — R109 Unspecified abdominal pain: Secondary | ICD-10-CM

## 2021-05-19 MED ORDER — PEPTO-BISMOL 524 MG/30ML PO SUSP: 30.0000 mL | Freq: Four times a day (QID) | ORAL | 0 refills | Status: DC | PRN

## 2021-05-19 MED ORDER — LOPERAMIDE HCL 2 MG PO TABS: 2.0000 mg | ORAL_TABLET | Freq: Four times a day (QID) | ORAL | 0 refills | Status: DC | PRN

## 2021-05-19 MED ORDER — ONDANSETRON HCL 4 MG PO TABS: 4.0000 mg | ORAL_TABLET | Freq: Three times a day (TID) | ORAL | 0 refills | Status: DC | PRN

## 2021-05-19 NOTE — Assessment & Plan Note (Signed)
Tylenol ibuprofen for pain.  Complete abdominal x-ray results pending.

## 2021-05-19 NOTE — Assessment & Plan Note (Signed)
Zofran 4 mg tablet by mouth every 8 hours as needed for nausea.  Follow-up with worsening unresolved symptoms.

## 2021-05-19 NOTE — Progress Notes (Signed)
Acute Office Visit  Subjective:    Patient ID: Vicki Robinson, female    DOB: 1966-04-14, 55 y.o.   MRN: [id number]  Chief Complaint  Patient presents with   Nausea   Diarrhea    Yellow, very runny   Headache   Back Pain    Left sided, felt like knot on back side   Chills    Diarrhea  This is a recurrent problem. The current episode started in the past 7 days. The problem has been unchanged. The patient states that diarrhea does not awaken her from sleep. Associated symptoms include abdominal pain, headaches and vomiting. Pertinent negatives include no coughing. Nothing aggravates the symptoms. There are no known risk factors. She has tried nothing for the symptoms. The treatment provided no relief.  Headache  This is a new problem. The current episode started yesterday. The problem has been unchanged. The pain is located in the Bilateral region. The pain does not radiate. The quality of the pain is described as aching. The pain is moderate. Associated symptoms include abdominal pain, back pain, nausea and vomiting. Pertinent negatives include no coughing or dizziness. Nothing aggravates the symptoms. She has tried nothing for the symptoms.  Back Pain This is a new problem. The current episode started yesterday. The problem is unchanged. The pain is present in the lumbar spine. The pain is mild. The symptoms are aggravated by position. Associated symptoms include abdominal pain and headaches. She has tried nothing for the symptoms. The treatment provided no relief.    Past Medical History:  Diagnosis Date   Hypertension     Past Surgical History:  Procedure Laterality Date   TUBAL LIGATION      Family History  Problem Relation Age of Onset   Arthritis Mother    Depression Mother    Hypertension Mother    Miscarriages / [place] Mother    Stroke Mother    Arthritis Brother    COPD Brother    Hearing loss Brother    Cancer Maternal Grandmother     Social History    Socioeconomic History   Marital status: Married    Spouse name: Not on file   Number of children: Not on file   Years of education: Not on file   Highest education level: Not on file  Occupational History   Not on file  Tobacco Use   Smoking status: Every Day    Packs/day: 0.25    Types: Cigarettes   Smokeless tobacco: Never   Tobacco comments:    7-8 cigs per day  Vaping Use   Vaping Use: Never used  Substance and Sexual Activity   Alcohol use: Yes    Comment: occ.   Drug use: No   Sexual activity: Yes    Birth control/protection: Post-menopausal, Surgical  Other Topics Concern   Not on file  Social History Narrative   Not on file   Social Determinants of Health   Financial Resource Strain: Not on file  Food Insecurity: No Food Insecurity   Worried About Running Out of Food in the Last Year: Never true   Ran Out of Food in the Last Year: Never true  Transportation Needs: No Transportation Needs   Lack of Transportation (Medical): No   Lack of Transportation (Non-Medical): No  Physical Activity: Not on file  Stress: Not on file  Social Connections: Not on file  Intimate Partner Violence: Not on file    Outpatient Medications Prior to Visit  Medication Sig  Dispense Refill   amLODipine (NORVASC) 2.5 MG tablet Take 1 tablet (2.5 mg total) by mouth daily. 90 tablet 4   Aspirin-Salicylamide-Caffeine (BC HEADACHE POWDER PO) Take by mouth daily.     polyethylene glycol (MIRALAX / GLYCOLAX) 17 g packet Take 17 g by mouth daily. As needed     Sennosides-Docusate Sodium (SENNA-DOCUSATE SODIUM PO) Take by mouth. As needed     No facility-administered medications prior to visit.    Allergies  Allergen Reactions   Penicillins Hives    Review of Systems  Constitutional: Negative.   HENT: Negative.    Respiratory:  Negative for cough.   Gastrointestinal:  Positive for abdominal pain, diarrhea, nausea and vomiting.  Musculoskeletal:  Positive for back pain.   Neurological:  Positive for headaches. Negative for dizziness.  All other systems reviewed and are negative.     Objective:    Physical Exam Vitals and nursing note reviewed.  HENT:     Head: Normocephalic.     Right Ear: Ear canal and external ear normal.     Left Ear: Ear canal and external ear normal.     Nose: Nose normal.     Mouth/Throat:     Mouth: Mucous membranes are moist.     Pharynx: Oropharynx is clear.  Eyes:     Conjunctiva/sclera: Conjunctivae normal.  Cardiovascular:     Rate and Rhythm: Normal rate and regular rhythm.     Pulses: Normal pulses.     Heart sounds: Normal heart sounds.  Pulmonary:     Effort: Pulmonary effort is normal.     Breath sounds: Normal breath sounds.  Abdominal:     General: Bowel sounds are normal. There is no distension.     Tenderness: There is abdominal tenderness. There is no guarding or rebound.  Skin:    General: Skin is warm.  Neurological:     General: No focal deficit present.     Mental Status: She is alert and oriented to person, place, and time.  Psychiatric:        Mood and Affect: Mood normal.        Behavior: Behavior normal.    BP 120/77   Pulse 98   Temp (!) 97.2 F (36.2 C) (Temporal)   Ht 5' 3" (1.6 m)   Wt 150 lb 12.8 oz (68.4 kg)   SpO2 97%   BMI 26.71 kg/m  Wt Readings from Last 3 Encounters:  05/19/21 150 lb 12.8 oz (68.4 kg)  03/29/21 156 lb 12.8 oz (71.1 kg)  08/13/20 168 lb 12.8 oz (76.6 kg)    Health Maintenance Due  Topic Date Due   COVID-19 Vaccine (1) Never done   Pneumococcal Vaccine 71-11 Years old (1 - PCV) Never done   COLONOSCOPY (Pts 45-53yr Insurance coverage will need to be confirmed)  Never done    There are no preventive care reminders to display for this patient.   Lab Results  Component Value Date   TSH 0.643 03/29/2021   Lab Results  Component Value Date   WBC 10.1 03/29/2021   HGB 13.8 03/29/2021   HCT 41.7 03/29/2021   MCV 87 03/29/2021   PLT 312  03/29/2021   Lab Results  Component Value Date   NA 139 03/29/2021   K 4.6 03/29/2021   CO2 23 03/29/2021   GLUCOSE 119 (H) 03/29/2021   BUN 14 03/29/2021   CREATININE 0.86 03/29/2021   BILITOT <0.2 03/29/2021   ALKPHOS 120 03/29/2021  AST 22 03/29/2021   ALT 16 03/29/2021   PROT 6.8 03/29/2021   ALBUMIN 4.4 03/29/2021   CALCIUM 9.5 03/29/2021   EGFR 79 03/29/2021   Lab Results  Component Value Date   CHOL 213 (H) 03/29/2021   Lab Results  Component Value Date   HDL 73 03/29/2021   Lab Results  Component Value Date   LDLCALC 123 (H) 03/29/2021   Lab Results  Component Value Date   TRIG 98 03/29/2021   Lab Results  Component Value Date   CHOLHDL 2.9 03/29/2021   Lab Results  Component Value Date   HGBA1C 5.6 04/09/2021       Assessment & Plan:   Problem List Items Addressed This Visit       Other   Nausea - Primary    Zofran 4 mg tablet by mouth every 8 hours as needed for nausea.  Follow-up with worsening unresolved symptoms.      Relevant Medications   ondansetron (ZOFRAN) 4 MG tablet   bismuth subsalicylate (PEPTO-BISMOL) 262 MG/15ML suspension   Other Relevant Orders   DG Abd 1 View   Abdominal pain    Tylenol ibuprofen for pain.  Complete abdominal x-ray results pending.      Relevant Medications   bismuth subsalicylate (PEPTO-BISMOL) 262 MG/15ML suspension   Diarrhea    Imodium 2 mg tablet by mouth as needed for diarrhea.      Relevant Medications   loperamide (IMODIUM A-D) 2 MG tablet     Meds ordered this encounter  Medications   ondansetron (ZOFRAN) 4 MG tablet    Sig: Take 1 tablet (4 mg total) by mouth every 8 (eight) hours as needed for nausea or vomiting.    Dispense:  20 tablet    Refill:  0    Order Specific Question:   Supervising Provider    Answer:   STACKS, WARREN [[id number]]   bismuth subsalicylate (PEPTO-BISMOL) 262 MG/15ML suspension    Sig: Take 30 mLs by mouth every 6 (six) hours as needed.    Dispense:  360 mL     Refill:  0    Order Specific Question:   Supervising Provider    Answer:   Jeneen Rinks   loperamide (IMODIUM A-D) 2 MG tablet    Sig: Take 1 tablet (2 mg total) by mouth 4 (four) times daily as needed for diarrhea or loose stools.    Dispense:  30 tablet    Refill:  0    Order Specific Question:   Supervising Provider    Answer:   Claretta Fraise [[id number]]     Ivy Lynn, NP

## 2021-05-19 NOTE — Patient Instructions (Signed)
Abdominal Pain, Adult Many things can cause belly (abdominal) pain. Most times, belly pain is not dangerous. Many cases of belly pain can be watched and treated at home. Sometimes, though, belly pain is serious. Your doctor will try to find the cause of your belly pain. Follow these instructions at home: Medicines Take over-the-counter and prescription medicines only as told by your doctor. Do not take medicines that help you poop (laxatives) unless told by your doctor. General instructions Watch your belly pain for any changes. Drink enough fluid to keep your pee (urine) pale yellow. Keep all follow-up visits as told by your doctor. This is important. Contact a doctor if: Your belly pain changes or gets worse. You are not hungry, or you lose weight without trying. You are having trouble pooping (constipated) or have watery poop (diarrhea) for more than 2-3 days. You have pain when you pee or poop. Your belly pain wakes you up at night. Your pain gets worse with meals, after eating, or with certain foods. You are vomiting and cannot keep anything down. You have a fever. You have blood in your pee. Get help right away if: Your pain does not go away as soon as your doctor says it should. You cannot stop vomiting. Your pain is only in areas of your belly, such as the right side or the left lower part of the belly. You have bloody or black poop, or poop that looks like tar. You have very bad pain, cramping, or bloating in your belly. You have signs of not having enough fluid or water in your body (dehydration), such as: Dark pee, very little pee, or no pee. Cracked lips. Dry mouth. Sunken eyes. Sleepiness. Weakness. You have trouble breathing or chest pain. Summary Many cases of belly pain can be watched and treated at home. Watch your belly pain for any changes. Take over-the-counter and prescription medicines only as told by your doctor. Contact a doctor if your belly pain changes  or gets worse. Get help right away if you have very bad pain, cramping, or bloating in your belly. This information is not intended to replace advice given to you by your health care provider. Make sure you discuss any questions you have with your health care provider. Document Revised: 10/08/2018 Document Reviewed: 10/08/2018 Elsevier Patient Education  2022 Elsevier Inc. Diarrhea, Adult Diarrhea is frequent loose and watery bowel movements. Diarrhea can make you feel weak and cause you to become dehydrated. Dehydration can make you tired and thirsty, cause you to have a dry mouth, and decrease how often you urinate. Diarrhea typically lasts 2-3 days. However, it can last longer if it is a sign of something more serious. It is important to treat your diarrhea as told by your health care provider. Follow these instructions at home: Eating and drinking   Follow these recommendations as told by your health care provider: Take an oral rehydration solution (ORS). This is an over-the-counter medicine that helps return your body to its normal balance of nutrients and water. It is found at pharmacies and retail stores. Drink plenty of fluids, such as water, ice chips, diluted fruit juice, and low-calorie sports drinks. You can drink milk also, if desired. Avoid drinking fluids that contain a lot of sugar or caffeine, such as energy drinks, sports drinks, and soda. Eat bland, easy-to-digest foods in small amounts as you are able. These foods include bananas, applesauce, rice, lean meats, toast, and crackers. Avoid alcohol. Avoid spicy or fatty foods.  Medicines  Take over-the-counter and prescription medicines only as told by your health care provider. If you were prescribed an antibiotic medicine, take it as told by your health care provider. Do not stop using the antibiotic even if you start to feel better. General instructions  Wash your hands often using soap and water. If soap and water are not  available, use a hand sanitizer. Others in the household should wash their hands as well. Hands should be washed: After using the toilet or changing a diaper. Before preparing, cooking, or serving food. While caring for a sick person or while visiting someone in a hospital. Drink enough fluid to keep your urine pale yellow. Rest at home while you recover. Watch your condition for any changes. Take a warm bath to relieve any burning or pain from frequent diarrhea episodes. Keep all follow-up visits as told by your health care provider. This is important. Contact a health care provider if: You have a fever. Your diarrhea gets worse. You have new symptoms. You cannot keep fluids down. You feel light-headed or dizzy. You have a headache. You have muscle cramps. Get help right away if: You have chest pain. You feel extremely weak or you faint. You have bloody or black stools or stools that look like tar. You have severe pain, cramping, or bloating in your abdomen. You have trouble breathing or you are breathing very quickly. Your heart is beating very quickly. Your skin feels cold and clammy. You feel confused. You have signs of dehydration, such as: Dark urine, very little urine, or no urine. Cracked lips. Dry mouth. Sunken eyes. Sleepiness. Weakness. Summary Diarrhea is frequent loose and sometimes watery bowel movements. Diarrhea can make you feel weak and cause you to become dehydrated. Drink enough fluids to keep your urine pale yellow. Make sure that you wash your hands after using the toilet. If soap and water are not available, use hand sanitizer. Contact a health care provider if your diarrhea gets worse or you have new symptoms. Get help right away if you have signs of dehydration. This information is not intended to replace advice given to you by your health care provider. Make sure you discuss any questions you have with your health care provider. Document Revised:  12/09/2020 Document Reviewed: 12/09/2020 Elsevier Patient Education  2022 Elsevier Inc. Nausea and Vomiting, Adult Nausea is feeling that you have an upset stomach and that you are about to vomit. Vomiting is when food in your stomach forcefully comes out of your mouth. Vomiting can make you feel weak. If you vomit, or if you are not able to drink enough fluids, you may not have enough water in your body (get dehydrated). If you do not have enough water in your body, you may: Feel tired. Feel thirsty. Have a dry mouth. Have cracked lips. Pee (urinate) less often. Older adults and people with other diseases or a weak body defense system (immune system) are at higher risk for not having enough water in the body. If you feel like you may vomit or you vomit, it is important to follow instructions from your doctor about how to take care of yourself. Follow these instructions at home: Watch your symptoms for any changes. Tell your doctor about them. Eating and drinking   Take an ORS (oral rehydration solution). This is a drink that is sold at pharmacies and stores. Drink clear fluids in small amounts as you are able, such as: Water. Ice chips. Fruit juice that has water added (diluted  fruit juice). Low-calorie sports drinks. Eat bland, easy-to-digest foods in small amounts as you are able, such as: Bananas. Applesauce. Rice. Low-fat (lean) meats. Toast. Crackers. Avoid drinking fluids that have a lot of sugar or caffeine in them. This includes energy drinks, sports drinks, and soda. Avoid alcohol. Avoid spicy or fatty foods. General instructions Take over-the-counter and prescription medicines only as told by your doctor. Drink enough fluid to keep your pee (urine) pale yellow. Wash your hands often with soap and water for at least 20 seconds. If you cannot use soap and water, use hand sanitizer. Make sure that everyone in your home washes their hands well and often. Rest at home until  you feel better. Watch your condition for any changes. Take slow and deep breaths when you feel like you may vomit. Keep all follow-up visits. Contact a doctor if: Your symptoms get worse. You have new symptoms. You have a fever. You cannot drink fluids without vomiting. You feel like you may vomit for more than 2 days. You feel light-headed or dizzy. You have a headache. You have muscle cramps. You have a rash. You have pain while peeing. Get help right away if: You have pain in your chest, neck, arm, or jaw. You feel very weak or you faint. You vomit again and again. You have vomit that is bright red or looks like black coffee grounds. You have bloody or black poop (stools) or poop that looks like tar. You have a very bad headache, a stiff neck, or both. You have very bad pain, cramping, or bloating in your belly (abdomen). You have trouble breathing. You are breathing very quickly. Your heart is beating very quickly. Your skin feels cold and clammy. You feel confused. You have signs of losing too much water in your body, such as: Dark pee, very little pee, or no pee. Cracked lips. Dry mouth. Sunken eyes. Sleepiness. Weakness. These symptoms may be an emergency. Get help right away. Call 911. Do not wait to see if the symptoms will go away. Do not drive yourself to the hospital. Summary Nausea is feeling that you have an upset stomach and that you are about to vomit. Vomiting is when food in your stomach comes out of your mouth. Follow instructions from your doctor about eating and drinking. Take over-the-counter and prescription medicines only as told by your doctor. Contact your doctor if your symptoms get worse or you have new symptoms. Keep all follow-up visits. This information is not intended to replace advice given to you by your health care provider. Make sure you discuss any questions you have with your health care provider. Document Revised: 12/04/2020  Document Reviewed: 12/04/2020 Elsevier Patient Education  2022 [employer].

## 2021-05-19 NOTE — Assessment & Plan Note (Signed)
Imodium 2 mg tablet by mouth as needed for diarrhea.

## 2021-05-20 ENCOUNTER — Encounter: Payer: Self-pay | Admitting: Nurse Practitioner

## 2021-05-20 ENCOUNTER — Other Ambulatory Visit: Payer: Self-pay | Admitting: Nurse Practitioner

## 2021-05-20 ENCOUNTER — Other Ambulatory Visit: Payer: Self-pay | Admitting: Family Medicine

## 2021-05-20 DIAGNOSIS — R109 Unspecified abdominal pain: Secondary | ICD-10-CM

## 2021-05-20 DIAGNOSIS — R197 Diarrhea, unspecified: Secondary | ICD-10-CM

## 2021-05-25 ENCOUNTER — Encounter: Payer: Self-pay | Admitting: Nurse Practitioner

## 2021-06-22 ENCOUNTER — Encounter: Payer: Self-pay | Admitting: Family Medicine

## 2021-06-23 ENCOUNTER — Other Ambulatory Visit: Payer: Self-pay | Admitting: Family Medicine

## 2021-06-23 DIAGNOSIS — Z1211 Encounter for screening for malignant neoplasm of colon: Secondary | ICD-10-CM

## 2021-07-01 ENCOUNTER — Ambulatory Visit: Payer: Self-pay | Admitting: Nurse Practitioner

## 2021-07-02 ENCOUNTER — Telehealth: Payer: Self-pay | Admitting: Physician Assistant

## 2021-07-02 DIAGNOSIS — R3989 Other symptoms and signs involving the genitourinary system: Secondary | ICD-10-CM

## 2021-07-02 DIAGNOSIS — B3731 Acute candidiasis of vulva and vagina: Secondary | ICD-10-CM

## 2021-07-02 MED ORDER — CEPHALEXIN 500 MG PO CAPS: 500.0000 mg | ORAL_CAPSULE | Freq: Two times a day (BID) | ORAL | 0 refills | Status: DC

## 2021-07-02 MED ORDER — FLUCONAZOLE 150 MG PO TABS: 150.0000 mg | ORAL_TABLET | Freq: Once | ORAL | 0 refills | Status: AC

## 2021-07-02 NOTE — Progress Notes (Signed)

## 2021-07-07 ENCOUNTER — Encounter: Payer: Self-pay | Admitting: Family Medicine

## 2021-07-07 NOTE — Telephone Encounter (Signed)
She will need OV, we cannot rx new meds outside of an OV. This is clinic policy.

## 2021-07-08 ENCOUNTER — Ambulatory Visit: Payer: Self-pay | Admitting: Nurse Practitioner

## 2021-07-08 ENCOUNTER — Encounter: Payer: Self-pay | Admitting: Nurse Practitioner

## 2021-07-08 VITALS — BP 109/66 | HR 72 | Temp 97.7°F | Resp 20 | Ht 63.0 in | Wt 151.0 lb

## 2021-07-08 DIAGNOSIS — N3281 Overactive bladder: Secondary | ICD-10-CM

## 2021-07-08 DIAGNOSIS — B3731 Acute candidiasis of vulva and vagina: Secondary | ICD-10-CM

## 2021-07-08 DIAGNOSIS — R350 Frequency of micturition: Secondary | ICD-10-CM

## 2021-07-08 LAB — URINALYSIS, COMPLETE
Bilirubin, UA: NEGATIVE
Glucose, UA: NEGATIVE
Ketones, UA: NEGATIVE
Leukocytes,UA: NEGATIVE
Nitrite, UA: NEGATIVE
Protein,UA: NEGATIVE
RBC, UA: NEGATIVE
Specific Gravity, UA: 1.02 (ref 1.005–1.030)
Urobilinogen, Ur: 0.2 mg/dL (ref 0.2–1.0)
pH, UA: 7 (ref 5.0–7.5)

## 2021-07-08 LAB — MICROSCOPIC EXAMINATION
Bacteria, UA: NONE SEEN
Epithelial Cells (non renal): NONE SEEN /hpf (ref 0–10)
RBC, Urine: NONE SEEN /hpf (ref 0–2)
Renal Epithel, UA: NONE SEEN /hpf
WBC, UA: NONE SEEN /hpf (ref 0–5)

## 2021-07-08 MED ORDER — FLUCONAZOLE 150 MG PO TABS: ORAL_TABLET | ORAL | 0 refills | Status: DC

## 2021-07-08 MED ORDER — TOLTERODINE TARTRATE ER 2 MG PO CP24: 2.0000 mg | ORAL_CAPSULE | Freq: Every day | ORAL | 5 refills | Status: DC

## 2021-07-08 NOTE — Patient Instructions (Signed)

## 2021-07-08 NOTE — Progress Notes (Signed)
° °  Subjective:    Patient ID: Vicki Robinson, female    DOB: [birth year], 56 y.o.   MRN: [id number]   Chief Complaint: uti  HPI Patient did an evisit on 07/02/21 and was treated for UTI with keflex and was also given a diflucan.she has one more dose of keflex left and she still report urgency and frequency and some perineal itching.    Review of Systems  Constitutional:  Negative for diaphoresis.  Eyes:  Negative for pain.  Respiratory:  Negative for shortness of breath.   Cardiovascular:  Negative for chest pain, palpitations and leg swelling.  Gastrointestinal:  Negative for abdominal pain.  Endocrine: Negative for polydipsia.  Genitourinary:  Positive for frequency and urgency. Negative for dysuria.  Skin:  Negative for rash.  Neurological:  Negative for dizziness, weakness and headaches.  Hematological:  Does not bruise/bleed easily.  All other systems reviewed and are negative.     Objective:   Physical Exam Vitals reviewed.  Constitutional:      Appearance: Normal appearance.  Cardiovascular:     Rate and Rhythm: Normal rate and regular rhythm.     Heart sounds: Normal heart sounds.  Pulmonary:     Effort: Pulmonary effort is normal.     Breath sounds: Normal breath sounds.  Neurological:     General: No focal deficit present.     Mental Status: She is alert and oriented to person, place, and time.  Psychiatric:        Mood and Affect: Mood normal.        Behavior: Behavior normal.   BP 109/66    Pulse 72    Temp 97.7 F (36.5 C) (Temporal)    Resp 20    Ht 5\' 3"  (1.6 m)    Wt 151 lb (68.5 kg)    SpO2 98%    BMI 26.75 kg/m   Urine clear      Assessment & Plan:   Vicki Robinson in today with chief complaint of Urinary Tract Infection   1. Frequent urination Will run culture just to make sure urine is clear - Urinalysis, Complete - Urine Culture  2. Overactive bladder Continue to drink water - tolterodine (DETROL LA) 2 MG 24 hr capsule; Take 1 capsule (2  mg total) by mouth daily.  Dispense: 30 capsule; Refill: 5  3. Vaginal candidiasis Diflucan 1 weekly for 4 weeks    The above assessment and management plan was discussed with the patient. The patient verbalized understanding of and has agreed to the management plan. Patient is aware to call the clinic if symptoms persist or worsen. Patient is aware when to return to the clinic for a follow-up visit. Patient educated on when it is appropriate to go to the emergency department.   Mary-Margaret Rick Duff, FNP

## 2021-07-10 LAB — URINE CULTURE: Organism ID, Bacteria: NO GROWTH

## 2021-08-04 ENCOUNTER — Ambulatory Visit: Payer: Self-pay | Admitting: Gastroenterology

## 2021-10-13 ENCOUNTER — Telehealth: Payer: Self-pay | Admitting: Family Medicine

## 2021-10-13 ENCOUNTER — Encounter: Payer: Self-pay | Admitting: Family Medicine

## 2021-10-13 DIAGNOSIS — Z1211 Encounter for screening for malignant neoplasm of colon: Secondary | ICD-10-CM

## 2021-10-13 NOTE — Telephone Encounter (Signed)
REFERRAL REQUEST ?Telephone Note ? ?Have you been seen at our office for this problem? YES ?(Advise that they may need an appointment with their PCP before a referral can be done) ? ?Reason for Referral: First colonoscopy ?Referral discussed with patient: Far as she knows  ?Best contact number of patient for referral team: [phone]    ?Has patient been seen by a specialist for this issue before: NO  ?Patient provider preference for referral: Whatever Dr. Darnell Level thinks ?Patient location preference for referral: [place] ?  ?Patient notified that referrals can take up to a week or longer to process. If they haven't heard anything within a week they should call back and speak with the referral department.   ?

## 2021-10-13 NOTE — Telephone Encounter (Signed)
done

## 2021-10-18 ENCOUNTER — Ambulatory Visit: Payer: Self-pay | Admitting: Gastroenterology

## 2021-10-19 ENCOUNTER — Encounter: Payer: Self-pay | Admitting: Family Medicine

## 2021-10-20 NOTE — Telephone Encounter (Signed)
Amy, this is an excellent question to send to the referral coordinator Courtney.  I have cc'd her this message as a [employer] officer, political [employer].  ?

## 2021-10-28 ENCOUNTER — Encounter (INDEPENDENT_AMBULATORY_CARE_PROVIDER_SITE_OTHER): Payer: Self-pay | Admitting: *Deleted

## 2021-11-01 ENCOUNTER — Other Ambulatory Visit (HOSPITAL_COMMUNITY): Payer: Self-pay | Admitting: Obstetrics and Gynecology

## 2021-11-01 DIAGNOSIS — Z1231 Encounter for screening mammogram for malignant neoplasm of breast: Secondary | ICD-10-CM

## 2021-11-11 ENCOUNTER — Other Ambulatory Visit (INDEPENDENT_AMBULATORY_CARE_PROVIDER_SITE_OTHER): Payer: Self-pay

## 2021-11-11 DIAGNOSIS — Z1211 Encounter for screening for malignant neoplasm of colon: Secondary | ICD-10-CM

## 2021-11-21 IMAGING — MG MM DIGITAL SCREENING BILAT W/ TOMO AND CAD
6 of 10 series · 6 of 30 positions shown · non-contrast
Comparison: Previous exam(s).

CLINICAL DATA: Screening.

EXAM:
DIGITAL SCREENING BILATERAL MAMMOGRAM WITH TOMOSYNTHESIS AND CAD
TECHNIQUE: Bilateral screening digital craniocaudal and mediolateral oblique
mammograms were obtained. Bilateral screening digital breast
tomosynthesis was performed. The images were evaluated with
computer-aided detection.

[R CC synth-2D]
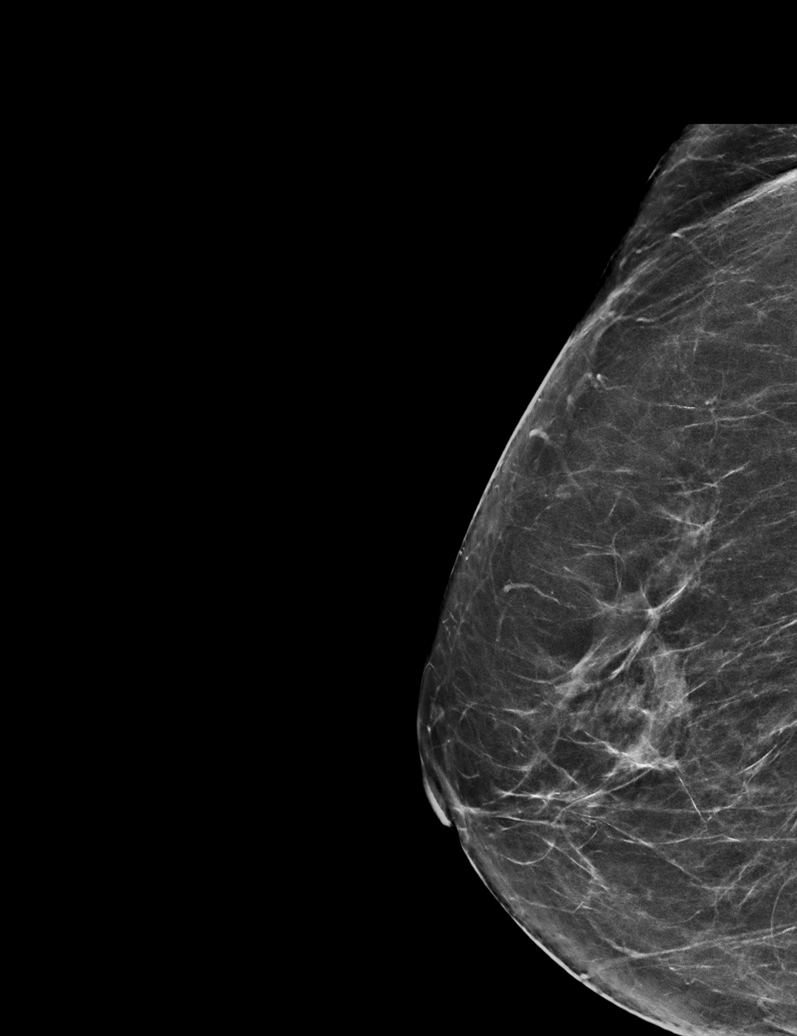

[L MLO synth-2D (1 of 2)]
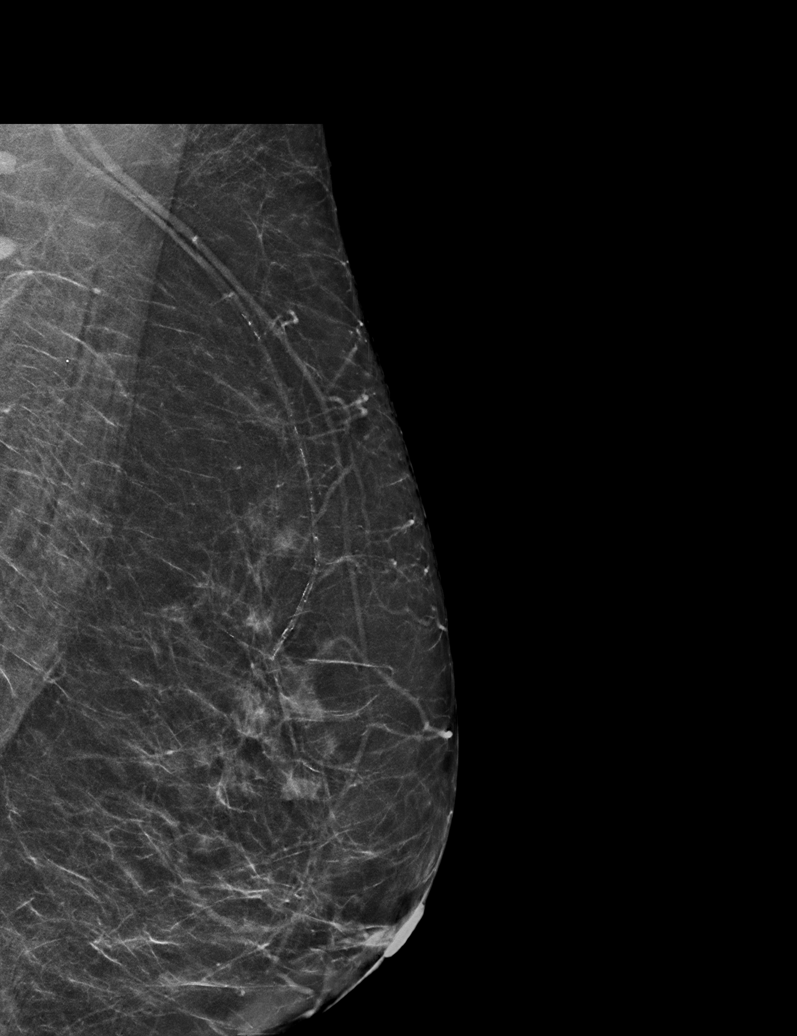

[R MLO synth-2D]
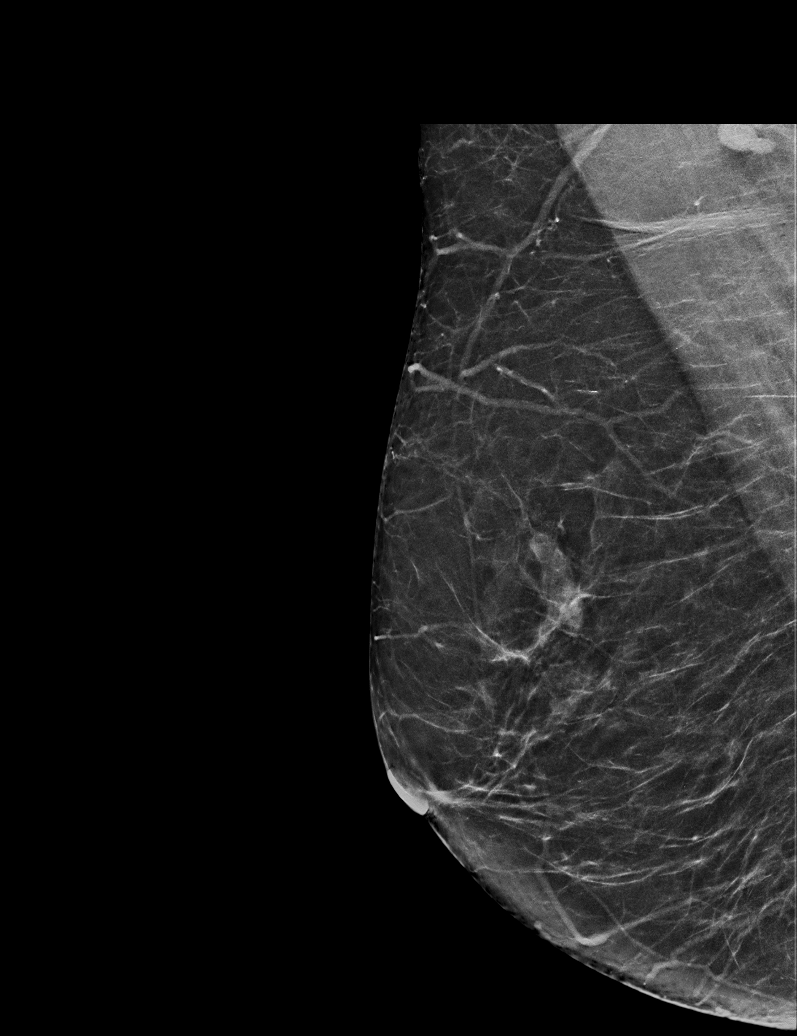

[L CC synth-2D]
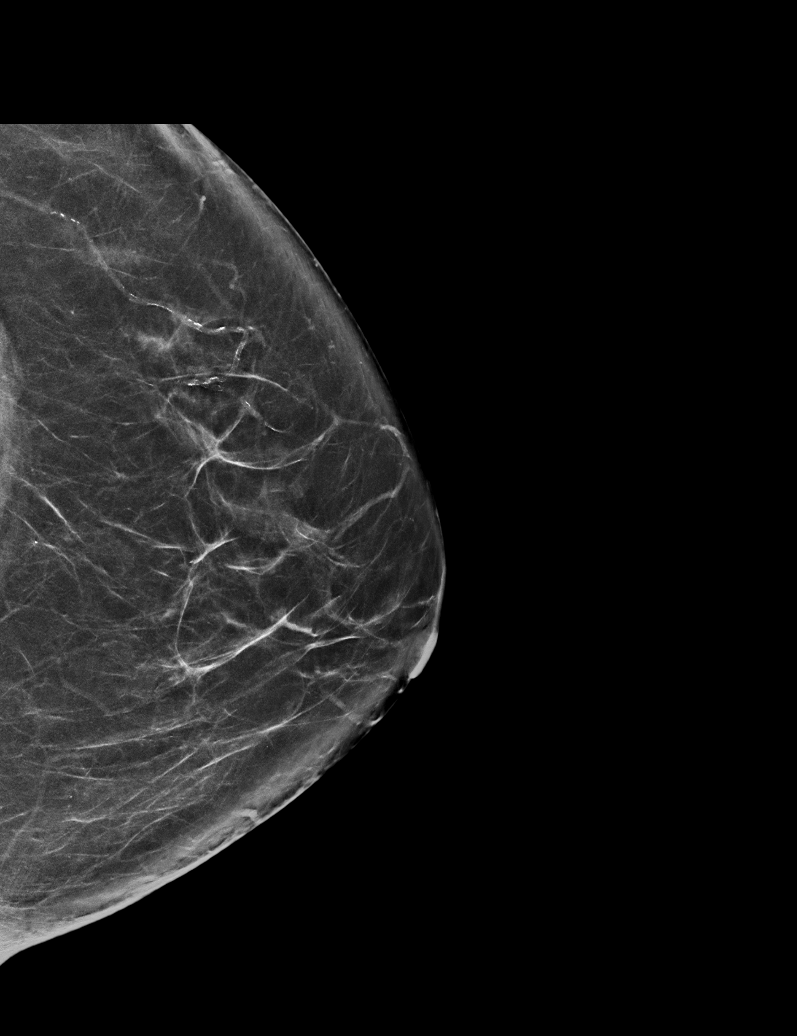

[L MLO synth-2D (2 of 2)]
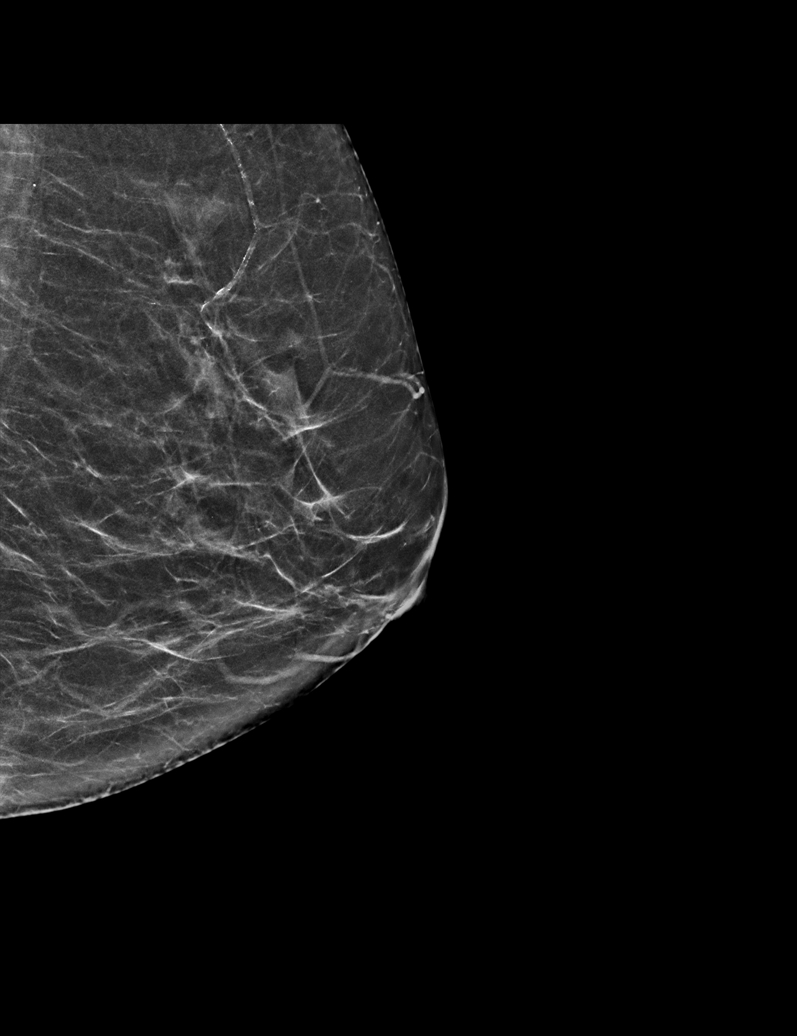

[L MLO tomo · tomo slice 33/64.0]
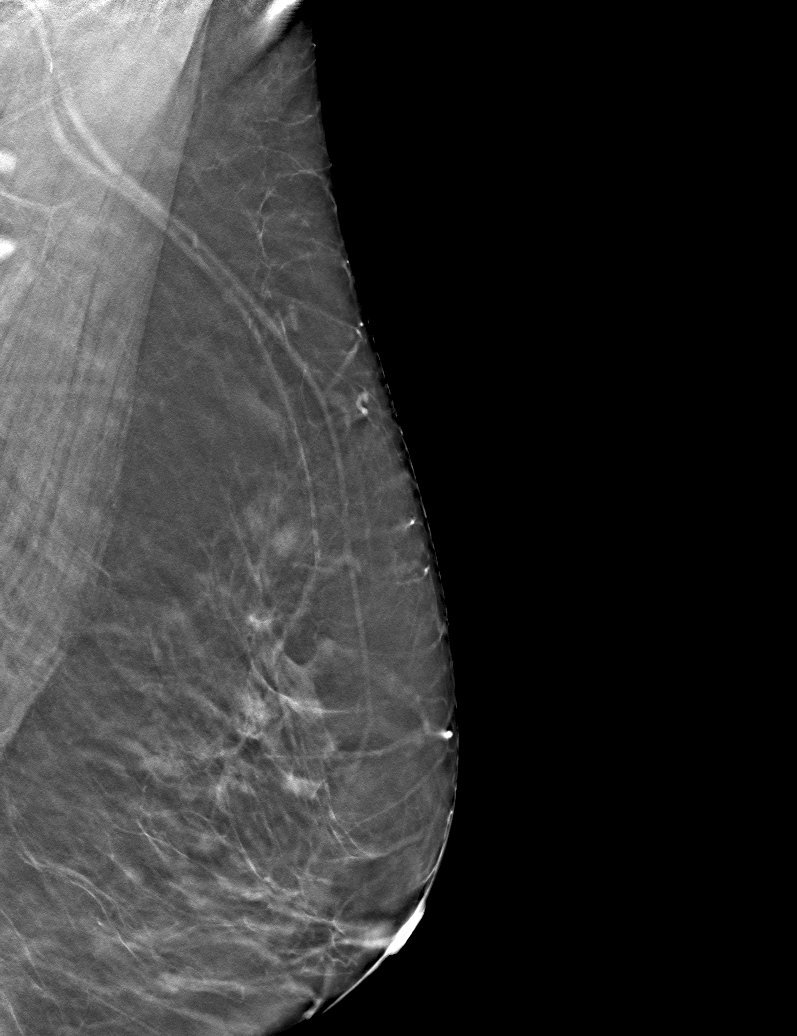

[6 of 30 positions shown; findings below may reference images not displayed]

ACR Breast Density Category b: There are scattered areas of
fibroglandular density.
FINDINGS: There are no findings suspicious for malignancy.
IMPRESSION: No mammographic evidence of malignancy. A result letter of this
screening mammogram will be mailed directly to the patient.

RECOMMENDATION:
Screening mammogram in one year. (Code:51-O-LD2)

BI-RADS CATEGORY  1: Negative.

## 2021-11-24 ENCOUNTER — Telehealth (INDEPENDENT_AMBULATORY_CARE_PROVIDER_SITE_OTHER): Payer: Self-pay

## 2021-11-24 ENCOUNTER — Encounter (INDEPENDENT_AMBULATORY_CARE_PROVIDER_SITE_OTHER): Payer: Self-pay

## 2021-11-24 MED ORDER — PEG 3350-KCL-NA BICARB-NACL 420 G PO SOLR: 4000.0000 mL | ORAL | 0 refills | Status: DC

## 2021-11-24 NOTE — Telephone Encounter (Signed)
Referring MD/PCP: Adam Phenix [facility])  Procedure: Tcs  Reason/Indication:  Screening   Has patient had this procedure before?  no  If so, when, by whom and where?    Is there a family history of colon cancer?  no  Who?  What age when diagnosed?    Is patient diabetic? If yes, Type 1 or Type 2   no      Does patient have prosthetic heart valve or mechanical valve?  no  Do you have a pacemaker/defibrillator?  no  Has patient ever had endocarditis/atrial fibrillation? no  Does patient use oxygen? no  Has patient had joint replacement within last 12 months?  no  Is patient constipated or do they take laxatives? no  Does patient have a history of alcohol/drug use?  no  Have you had a stroke/heart attack last 6 mths? no  Do you take medicine for weight loss?  no  For female patients,: have you had a hysterectomy no                      are you post menopausal yes                      do you still have your menstrual cycle no  Is patient on blood thinner such as Coumadin, Plavix and/or Aspirin? no  Medications: BC Powder (OTC) 1 to 1/2 daily, Caffeine 65 mg daily  Allergies: PCN (possible)  Medication Adjustment per Dr Jenetta Downer none   Procedure date & time: Friday 12/24/21 at 8:15

## 2021-11-24 NOTE — Telephone Encounter (Signed)
Krystan Northrop Ann Minas Bonser, CMA  ?

## 2021-11-24 NOTE — Telephone Encounter (Signed)
Ok to schedule.  Thanks,  Carleton Vanvalkenburgh Castaneda Mayorga, MD Gastroenterology and Hepatology [facility]  

## 2021-11-24 NOTE — Telephone Encounter (Signed)
Tanny Harnack Ann Darcella Shiffman, CMA  ?

## 2021-11-25 ENCOUNTER — Encounter: Payer: Self-pay | Admitting: Family Medicine

## 2021-12-07 ENCOUNTER — Encounter (INDEPENDENT_AMBULATORY_CARE_PROVIDER_SITE_OTHER): Payer: Self-pay

## 2021-12-31 ENCOUNTER — Ambulatory Visit: Payer: Self-pay | Admitting: Family Medicine

## 2022-01-19 ENCOUNTER — Other Ambulatory Visit (HOSPITAL_COMMUNITY): Payer: Self-pay

## 2022-01-21 ENCOUNTER — Ambulatory Visit (HOSPITAL_COMMUNITY): Payer: 59 | Admitting: Anesthesiology

## 2022-01-21 ENCOUNTER — Other Ambulatory Visit: Payer: Self-pay

## 2022-01-21 ENCOUNTER — Ambulatory Visit (HOSPITAL_COMMUNITY)
Admission: RE | Admit: 2022-01-21 | Discharge: 2022-01-21 | Disposition: A | Discharge status: Home / Self Care | Payer: 59 | Attending: Gastroenterology | Admitting: Gastroenterology

## 2022-01-21 ENCOUNTER — Ambulatory Visit (HOSPITAL_BASED_OUTPATIENT_CLINIC_OR_DEPARTMENT_OTHER): Payer: 59 | Admitting: Anesthesiology

## 2022-01-21 ENCOUNTER — Encounter (HOSPITAL_COMMUNITY)
Admission: RE | Disposition: A | Discharge status: Home / Self Care | Payer: Self-pay | Source: Home / Self Care | Attending: Gastroenterology

## 2022-01-21 ENCOUNTER — Encounter (HOSPITAL_COMMUNITY): Payer: Self-pay | Admitting: Gastroenterology

## 2022-01-21 DIAGNOSIS — Z1211 Encounter for screening for malignant neoplasm of colon: Secondary | ICD-10-CM | POA: Insufficient documentation

## 2022-01-21 DIAGNOSIS — F17210 Nicotine dependence, cigarettes, uncomplicated: Secondary | ICD-10-CM | POA: Diagnosis not present

## 2022-01-21 DIAGNOSIS — K648 Other hemorrhoids: Secondary | ICD-10-CM | POA: Diagnosis not present

## 2022-01-21 DIAGNOSIS — I10 Essential (primary) hypertension: Secondary | ICD-10-CM | POA: Insufficient documentation

## 2022-01-21 DIAGNOSIS — I7389 Other specified peripheral vascular diseases: Secondary | ICD-10-CM | POA: Diagnosis not present

## 2022-01-21 DIAGNOSIS — Z79899 Other long term (current) drug therapy: Secondary | ICD-10-CM | POA: Insufficient documentation

## 2022-01-21 HISTORY — DX: Nausea with vomiting, unspecified: R11.2

## 2022-01-21 HISTORY — DX: Other specified postprocedural states: Z98.890

## 2022-01-21 HISTORY — PX: COLONOSCOPY WITH PROPOFOL: SHX5780

## 2022-01-21 LAB — HM COLONOSCOPY

## 2022-01-21 SURGERY — COLONOSCOPY WITH PROPOFOL
Anesthesia: General

## 2022-01-21 MED ORDER — PROPOFOL 10 MG/ML IV BOLUS
INTRAVENOUS | Status: DC | PRN
  Administered 2022-01-21: 30 mg via INTRAVENOUS
  Administered 2022-01-21: 50 mg via INTRAVENOUS
  Administered 2022-01-21 (×3): 30 mg via INTRAVENOUS

## 2022-01-21 MED ORDER — LACTATED RINGERS IV SOLN: INTRAVENOUS | Status: DC | PRN

## 2022-01-21 MED ORDER — LACTATED RINGERS IV SOLN
INTRAVENOUS | Status: DC
  Administered 2022-01-21: 1000 mL via INTRAVENOUS

## 2022-01-21 NOTE — Anesthesia Preprocedure Evaluation (Addendum)
Anesthesia Evaluation  Patient identified by MRN, date of birth, ID band Patient awake    Reviewed: Allergy & Precautions, NPO status , Patient's Chart, lab work & pertinent test results  History of Anesthesia Complications (+) PONV and history of anesthetic complications  Airway Mallampati: II  TM Distance: >3 FB Neck ROM: Full    Dental  (+) Dental Advisory Given, Teeth Intact   Pulmonary Current Smoker and Patient abstained from smoking.,    Pulmonary exam normal breath sounds clear to auscultation       Cardiovascular hypertension, Pt. on medications + Peripheral Vascular Disease (right carotid disease)  Normal cardiovascular exam Rhythm:Regular Rate:Normal     Neuro/Psych negative neurological ROS  negative psych ROS   GI/Hepatic negative GI ROS, Neg liver ROS, Bowel prep,  Endo/Other  negative endocrine ROS  Renal/GU negative Renal ROS  negative genitourinary   Musculoskeletal negative musculoskeletal ROS (+)   Abdominal   Peds negative pediatric ROS (+)  Hematology negative hematology ROS (+)   Anesthesia Other Findings   Reproductive/Obstetrics negative OB ROS                            Anesthesia Physical Anesthesia Plan  ASA: 2  Anesthesia Plan: General   Post-op Pain Management: Minimal or no pain anticipated   Induction: Intravenous  PONV Risk Score and Plan: Propofol infusion  Airway Management Planned: Nasal Cannula and Natural Airway  Additional Equipment:   Intra-op Plan:   Post-operative Plan:   Informed Consent: I have reviewed the patients History and Physical, chart, labs and discussed the procedure including the risks, benefits and alternatives for the proposed anesthesia with the patient or authorized representative who has indicated his/her understanding and acceptance.     Dental advisory given  Plan Discussed with: CRNA and  Surgeon  Anesthesia Plan Comments:         Anesthesia Quick Evaluation

## 2022-01-21 NOTE — Transfer of Care (Signed)
Immediate Anesthesia Transfer of Care Note  Patient: Vicki Robinson  Procedure(s) Performed: COLONOSCOPY WITH PROPOFOL  Patient Location: PACU  Anesthesia Type:MAC  Level of Consciousness: awake, alert  and oriented  Airway & Oxygen Therapy: Patient Spontanous Breathing  Post-op Assessment: Report given to RN and Post -op Vital signs reviewed and stable  Post vital signs: Reviewed and stable  Last Vitals:  Vitals Value Taken Time  BP 108/68 01/21/22 0934  Temp 37.4 C 01/21/22 0934  Pulse 70 01/21/22 0934  Resp 14 01/21/22 0930  SpO2 99 % 01/21/22 0930    Last Pain:  Vitals:   01/21/22 0934  TempSrc: Oral  PainSc:       Patients Stated Pain Goal: 6 (88/75/79 7282)  Complications: No notable events documented.

## 2022-01-21 NOTE — Discharge Instructions (Signed)
You are being discharged to home.  Resume your previous diet.  Your physician has recommended a repeat colonoscopy in 10 years for screening purposes.  

## 2022-01-21 NOTE — Anesthesia Postprocedure Evaluation (Signed)
Anesthesia Post Note  Patient: Vicki Robinson  Procedure(s) Performed: COLONOSCOPY WITH PROPOFOL  Patient location during evaluation: Phase II Anesthesia Type: General Level of consciousness: awake and alert and oriented Pain management: pain level controlled Vital Signs Assessment: post-procedure vital signs reviewed and stable Respiratory status: spontaneous breathing, nonlabored ventilation and respiratory function stable Cardiovascular status: blood pressure returned to baseline and stable Postop Assessment: no apparent nausea or vomiting Anesthetic complications: no   No notable events documented.   Last Vitals:  Vitals:   01/21/22 0934 01/21/22 0936  BP: 108/68 128/74  Pulse: 70 (!) 58  Resp:  17  Temp: 37.4 C   SpO2:  99%    Last Pain:  Vitals:   01/21/22 0936  TempSrc:   PainSc: 0-No pain                 Sydney Azure C Clarence Dunsmore

## 2022-01-21 NOTE — H&P (Signed)
Vicki Robinson is an 56 y.o. female.   Chief Complaint: Screening colonoscopy HPI: 55 year old female with past medical history of hypertension, coming for screening colonoscopy. The patient has never had a colonoscopy in the past.  The patient denies having any complaints such as melena, hematochezia, abdominal pain or distention, change in her bowel movement consistency or frequency, no changes in weight recently.  No family history of colorectal cancer.  Notably, she takes 1 BC powder daily.  Past Medical History:  Diagnosis Date   Hypertension    PONV (postoperative nausea and vomiting)     Past Surgical History:  Procedure Laterality Date   TUBAL LIGATION      Family History  Problem Relation Age of Onset   Arthritis Mother    Depression Mother    Hypertension Mother    Miscarriages / [place] Mother    Stroke Mother    Arthritis Brother    COPD Brother    Hearing loss Brother    Cancer Maternal Grandmother    Social History:  reports that she has been smoking cigarettes. She has been smoking an average of .25 packs per day. She has never used smokeless tobacco. She reports current alcohol use. She reports that she does not use drugs.  Allergies:  Allergies  Allergen Reactions   Penicillins Hives    Medications Prior to Admission  Medication Sig Dispense Refill   amLODipine (NORVASC) 2.5 MG tablet Take 1 tablet (2.5 mg total) by mouth daily. (Patient taking differently: Take 2.5 mg by mouth daily as needed (elevated bp).) 90 tablet 4   Aspirin-Salicylamide-Caffeine (BC HEADACHE POWDER PO) Take 0.5-1 packets by mouth 2 (two) times daily as needed (pain.).     diclofenac (VOLTAREN) 75 MG EC tablet Take 75 mg by mouth daily as needed (pain.).     diphenhydramine-acetaminophen (TYLENOL PM EXTRA STRENGTH) 25-500 MG TABS tablet Take 1 tablet by mouth at bedtime as needed.     Menthol, Topical Analgesic, (BIOFREEZE ROLL-ON EX) Apply 1 Application topically 3 (three) times  daily as needed (pain.).     polyethylene glycol-electrolytes (TRILYTE) 420 g solution Take 4,000 mLs by mouth as directed. 4000 mL 0   Trolamine Salicylate (BLUE-EMU HEMP EX) Apply 1 Application topically 3 (three) times daily as needed (pain.).     tolterodine (DETROL LA) 2 MG 24 hr capsule Take 1 capsule (2 mg total) by mouth daily. (Patient not taking: Reported on 01/18/2022) 30 capsule 5    No results found for this or any previous visit (from the past 48 hour(s)). No results found.  Review of Systems  All other systems reviewed and are negative.   Blood pressure (!) 185/70, pulse 64, temperature 97.9 F (36.6 C), temperature source Oral, resp. rate 13, height 5\' 4"  (1.626 m), weight 64.4 kg, SpO2 99 %. Physical Exam  GENERAL: The patient is AO x3, in no acute distress. HEENT: Head is normocephalic and atraumatic. EOMI are intact. Mouth is well hydrated and without lesions. NECK: Supple. No masses LUNGS: Clear to auscultation. No presence of rhonchi/wheezing/rales. Adequate chest expansion HEART: RRR, normal s1 and s2. ABDOMEN: Soft, nontender, no guarding, no peritoneal signs, and nondistended. BS +. No masses. EXTREMITIES: Without any cyanosis, clubbing, rash, lesions or edema. NEUROLOGIC: AOx3, no focal motor deficit. SKIN: no jaundice, no rashes  Assessment/Plan 56 year old female with past medical history of hypertension, coming for screening colonoscopy. The patient is at average risk for colorectal cancer.  We will proceed with colonoscopy today.   Notably,  she takes 1 BC powder daily.  Dolores Frame, MD 01/21/2022, 7:41 AM

## 2022-01-21 NOTE — Op Note (Signed)
[facility] Patient Name: Vicki Robinson Procedure Date: 01/21/2022 9:03 AM MRN: [id number] Date of Birth: [birth year] Attending MD: Katrinka Blazing ,  CSN: [id number] Age: 56 Admit Type: Outpatient Procedure:                Colonoscopy Indications:              Screening for colorectal malignant neoplasm Providers:                Katrinka Blazing, Jannett Celestine, RN, Edrick Kins, RN Referring MD:              Medicines:                Monitored Anesthesia Care Complications:            No immediate complications. Estimated Blood Loss:     Estimated blood loss: none. Procedure:                Pre-Anesthesia Assessment:                           - Prior to the procedure, a History and Physical                            was performed, and patient medications, allergies                            and sensitivities were reviewed. The patient's                            tolerance of previous anesthesia was reviewed.                           - The risks and benefits of the procedure and the                            sedation options and risks were discussed with the                            patient. All questions were answered and informed                            consent was obtained.                           - ASA Grade Assessment: II - A patient with mild                            systemic disease.                           After obtaining informed consent, the colonoscope                            was passed under direct vision. Throughout the                            procedure, the patient's blood  pressure, pulse, and                            oxygen saturations were monitored continuously. The                            [id number]-[id number] ([id number]) scope was introduced through                            the anus and advanced to the the cecum, identified                            by appendiceal orifice and ileocecal valve. The                            colonoscopy was  performed without difficulty. The                            patient tolerated the procedure well. The quality                            of the bowel preparation was excellent. Scope In: 9:13:25 AM Scope Out: 9:27:41 AM Scope Withdrawal Time: 0 hours 11 minutes 8 seconds  Total Procedure Duration: 0 hours 14 minutes 16 seconds  Findings:      The perianal and digital rectal examinations were normal.      The colon (entire examined portion) appeared normal.      Non-bleeding internal hemorrhoids were found during retroflexion. The       hemorrhoids were small. Impression:               - The entire examined colon is normal.                           - Non-bleeding internal hemorrhoids.                           - No specimens collected. Moderate Sedation:      Per Anesthesia Care Recommendation:           - Discharge patient to home (ambulatory).                           - Resume previous diet.                           - Repeat colonoscopy in 10 years for screening                            purposes. Procedure Code(s):        --- Professional ---                           L8756, Colorectal cancer screening; colonoscopy on                            individual not meeting criteria for high risk Diagnosis Code(s):        ---  Professional ---                           Z12.11, Encounter for screening for malignant                            neoplasm of colon                           K64.8, Other hemorrhoids CPT copyright 2019 American Medical Association. All rights reserved. The codes documented in this report are preliminary and upon coder review may  be revised to meet current compliance requirements. Maylon Peppers, MD Maylon Peppers,  01/21/2022 9:35:40 AM This report has been signed electronically. Number of Addenda: 0

## 2022-01-24 ENCOUNTER — Encounter (INDEPENDENT_AMBULATORY_CARE_PROVIDER_SITE_OTHER): Payer: Self-pay | Admitting: *Deleted

## 2022-01-24 ENCOUNTER — Encounter: Payer: Self-pay | Admitting: Family Medicine

## 2022-01-26 ENCOUNTER — Encounter (HOSPITAL_COMMUNITY): Payer: Self-pay | Admitting: Gastroenterology

## 2022-02-04 ENCOUNTER — Ambulatory Visit (HOSPITAL_COMMUNITY)
Admission: RE | Admit: 2022-02-04 | Discharge: 2022-02-04 | Disposition: A | Discharge status: Home / Self Care | Payer: 59 | Source: Ambulatory Visit | Attending: Obstetrics and Gynecology | Admitting: Obstetrics and Gynecology

## 2022-02-04 ENCOUNTER — Inpatient Hospital Stay: Payer: 59 | Attending: Obstetrics and Gynecology | Admitting: *Deleted

## 2022-02-04 VITALS — BP 150/83 | Wt 146.0 lb

## 2022-02-04 DIAGNOSIS — Z1239 Encounter for other screening for malignant neoplasm of breast: Secondary | ICD-10-CM

## 2022-02-04 DIAGNOSIS — Z1231 Encounter for screening mammogram for malignant neoplasm of breast: Secondary | ICD-10-CM | POA: Insufficient documentation

## 2022-02-04 NOTE — Patient Instructions (Addendum)
Explained breast self awareness with Rick Duff. Patient did not need a Pap smear today due to last Pap smear and HPV typing was 12/18/2020. Let her know BCCCP will cover Pap smears and HPV typing every 5 years unless has a history of abnormal Pap smears. Referred patient to [facility] for a screening mammogram. Appointment scheduled Friday, February 04, 2022 at 1145. Patient aware of appointment and will be there. Let patient know [facility] will follow up with her within the next couple weeks with results of mammogram by letter or phone. Informed patient that Lequita Halt from the [facility] will follow up with her in a couple weeks to schedule her shared decision making visit and lung CT screening. Discussed smoking cessation with patient. Referred to the [place] quitline. Lotta Frankenfield verbalized understanding.  Kensie Susman, Kathaleen Maser, RN 11:10 AM

## 2022-02-04 NOTE — Progress Notes (Signed)
[name] is a 56 y.o. female who presents to [facility] clinic today with complaint of right upper breast pain that occurred 3-4 months ago that last two weeks. Patient stated she was building a chicken coup at the time and the pain started in her right arm and was radiating to the breast. Patient stated the pain resolved a couple weeks later and denies pain today.    Pap Smear: Pap smear not completed today. Last Pap smear was 12/18/2020 at [facility] clinic and was normal with negative HPV. Per patient has history of an abnormal Pap smear 31 years ago that a LEEP was completed for follow-up. Per patient all Pap smears have been normal and she has had at least three normal Pap smears since LEEP. Last Pap smear result is not available in Epic.   Physical exam: Breasts Breasts symmetrical. No skin abnormalities bilateral breasts. No nipple retraction bilateral breasts. No nipple discharge bilateral breasts. No lymphadenopathy. No lumps palpated bilateral breasts. No complaints of pain or tenderness on exam.     MS DIGITAL SCREENING TOMO BILATERAL  Result Date: 12/22/2020 CLINICAL DATA:  Screening. EXAM: DIGITAL SCREENING BILATERAL MAMMOGRAM WITH TOMOSYNTHESIS AND CAD TECHNIQUE: Bilateral screening digital craniocaudal and mediolateral oblique mammograms were obtained. Bilateral screening digital breast tomosynthesis was performed. The images were evaluated with computer-aided detection. COMPARISON:  Previous exam(s). ACR Breast Density Category b: There are scattered areas of fibroglandular density. FINDINGS: There are no findings suspicious for malignancy. IMPRESSION: No mammographic evidence of malignancy. A result letter of this screening mammogram will be mailed directly to the patient. RECOMMENDATION: Screening mammogram in one year. (Code:SM-B-01Y) BI-RADS CATEGORY  1: Negative. Electronically Signed   By: Hulan Saas M.D.   On: 12/22/2020 15:15     Pelvic/Bimanual Pap is not indicated today per  BCCCP guidelines.   Smoking History: Patient is a current smoker. Discussed smoking cessation with patient. Referred to the [facility] quitline. Discussed the free lung cancer screening and sent referral due to patient has greater than a 30 pack history of smoking.  Patient Navigation: Patient education provided. Access to services provided for patient through [facility] program.   Colorectal Cancer Screening: Per patient has had colonoscopy completed on 01/21/2022  No complaints today.    Breast and Cervical Cancer Risk Assessment: Patient does not have family history of breast cancer, known genetic mutations, or radiation treatment to the chest before age 34. Per patient has history of cervical dysplasia. Patient has no history of being immunocompromised or DES exposure in-utero.  Risk Assessment     Risk Scores       02/04/2022 12/18/2020   Last edited by: Narda Rutherford, LPN [name], CMA   5-year risk: 0.9 % 0.9 %   Lifetime risk: 5.7 % 5.9 %           A: BCCCP exam without pap smear No complaints today.  P: Referred patient to [facility] for a screening mammogram. Appointment scheduled Friday, February 04, 2022 at 1145.  Priscille Heidelberg, RN 02/04/2022 11:09 AM

## 2022-04-22 IMAGING — DX DG ABDOMEN 1V
2 series · 2 of 2 positions shown · non-contrast
Comparison: 12/26/2016

CLINICAL DATA: Abdomen pain

EXAM:
ABDOMEN - 1 VIEW

[abdomen kub (1 of 2)]
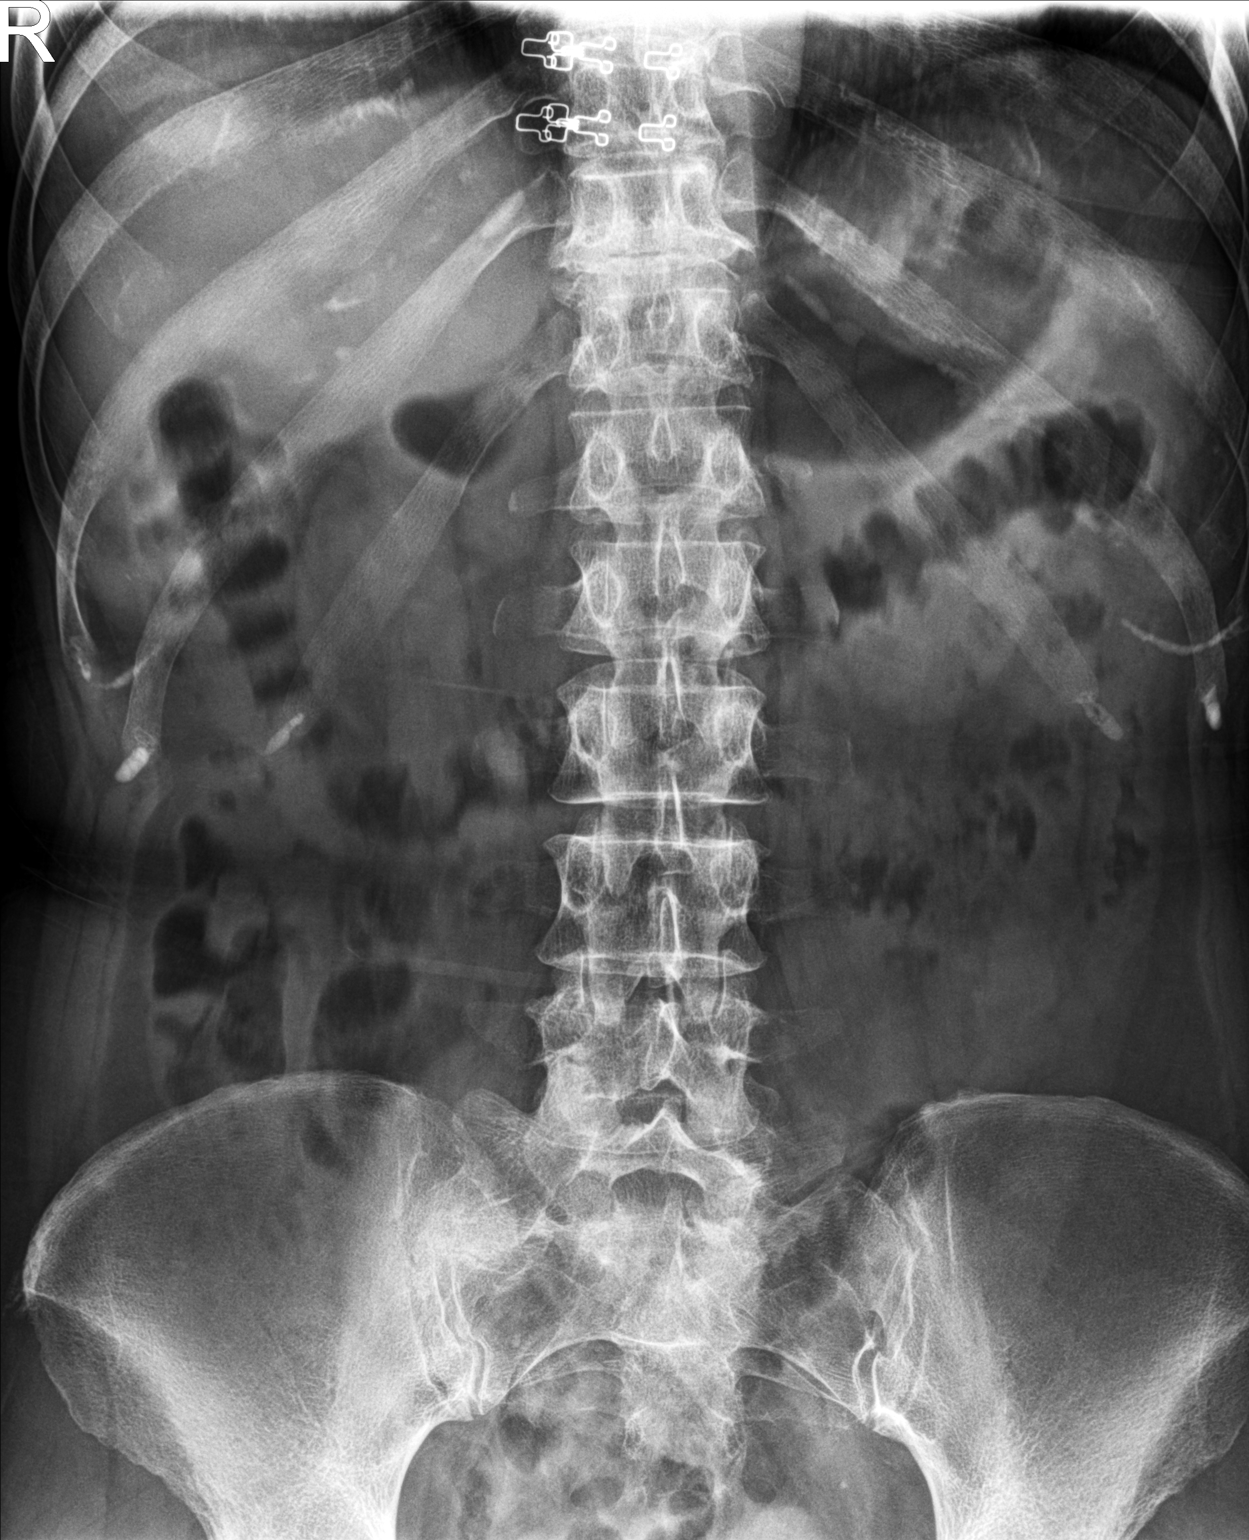

[abdomen kub (2 of 2)]
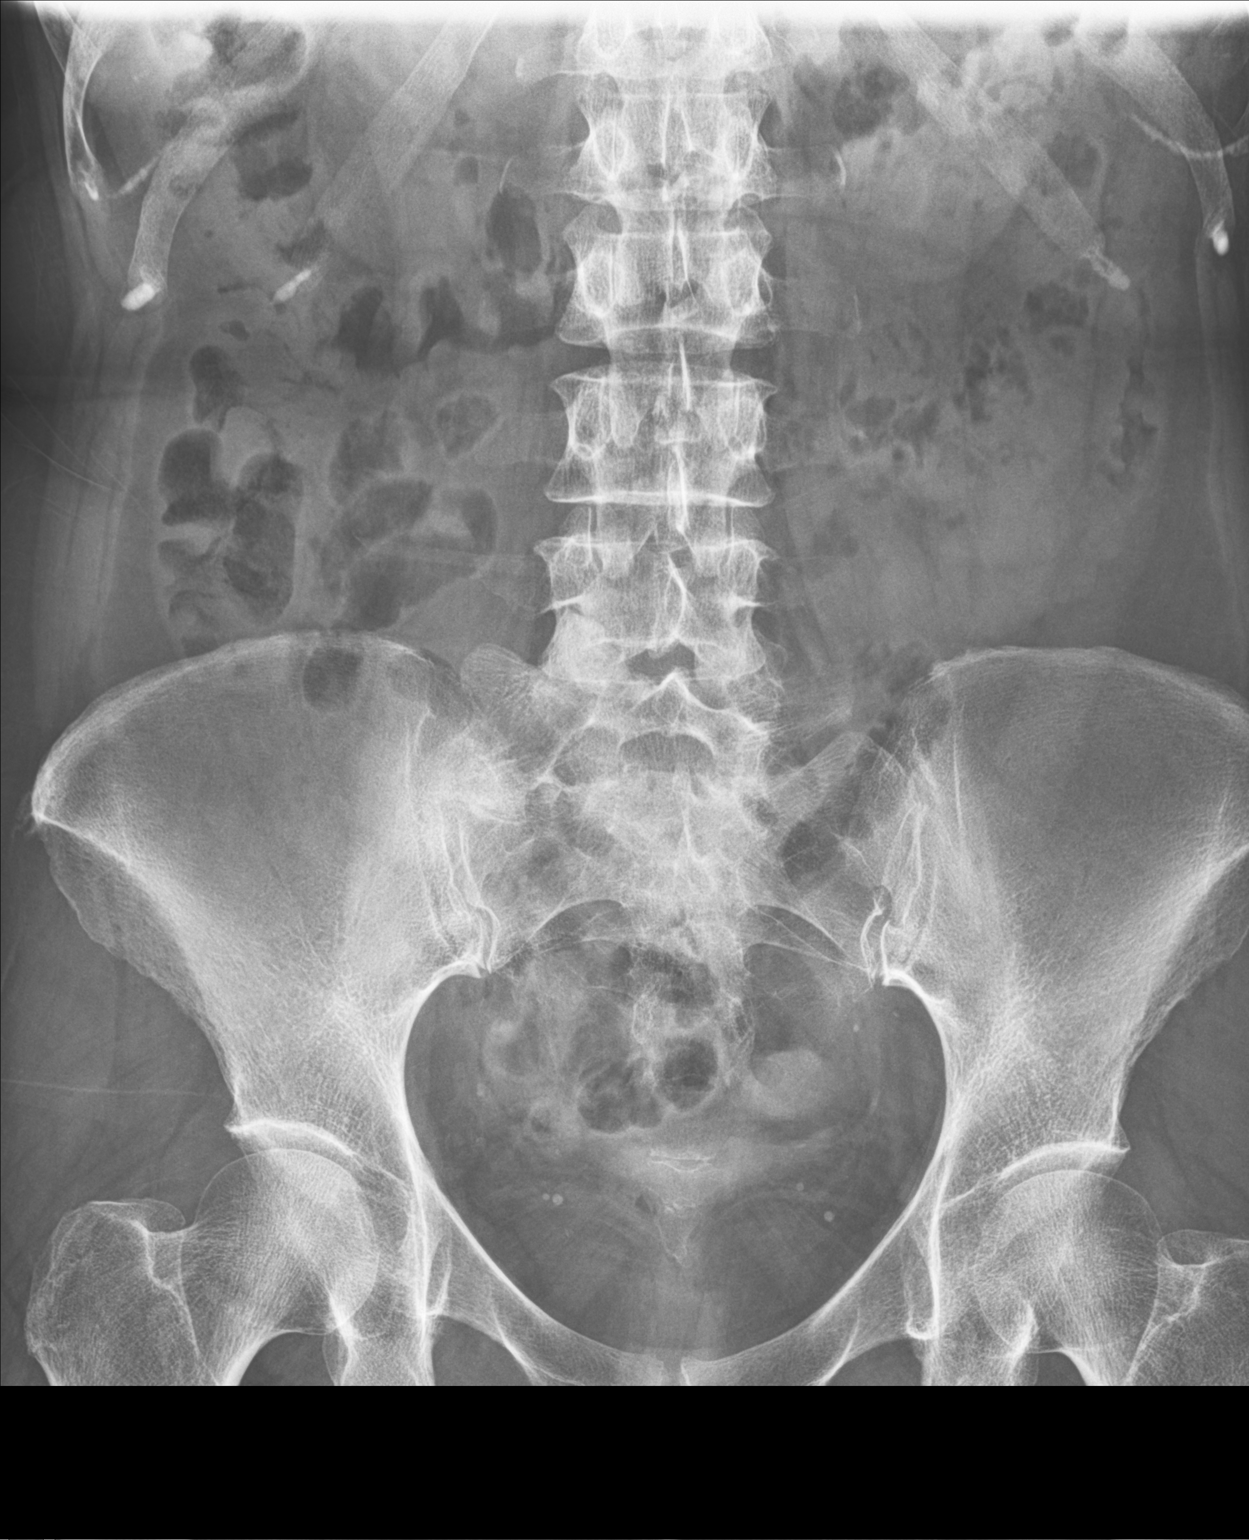

[2 of 2 positions shown; findings below may reference images not displayed]

FINDINGS: Nonobstructed gas pattern with mild stool. No radiopaque calculi
over the kidneys. Probable phleboliths in the pelvis.
IMPRESSION: Negative.

## 2022-12-12 ENCOUNTER — Ambulatory Visit: Payer: Self-pay | Admitting: Nurse Practitioner

## 2023-01-02 ENCOUNTER — Encounter: Payer: Self-pay | Admitting: Family Medicine

## 2023-01-03 ENCOUNTER — Encounter: Payer: Self-pay | Admitting: Nurse Practitioner

## 2023-01-03 ENCOUNTER — Ambulatory Visit (INDEPENDENT_AMBULATORY_CARE_PROVIDER_SITE_OTHER): Payer: Self-pay | Admitting: Nurse Practitioner

## 2023-01-03 ENCOUNTER — Encounter: Payer: Self-pay | Admitting: Family Medicine

## 2023-01-03 VITALS — BP 168/81 | HR 69 | Temp 97.5°F | Resp 20 | Ht 64.0 in | Wt 141.0 lb

## 2023-01-03 DIAGNOSIS — H9201 Otalgia, right ear: Secondary | ICD-10-CM

## 2023-01-03 MED ORDER — PREDNISONE 20 MG PO TABS: 40.0000 mg | ORAL_TABLET | Freq: Every day | ORAL | 0 refills | Status: AC

## 2023-01-03 NOTE — Progress Notes (Signed)
   Subjective:    Patient ID: Vicki Robinson, female    DOB: 30-Jan-1966, 57 y.o.   MRN: [id number]   Chief Complaint: ear pain  HPI  Patient has been having ear pain for several weeks. The dentist thought it was coming from tooth. Had some dental work done but ear is not completely better. The dentist said it is nerve  pain and can take several months to improve.  Patient Active Problem List   Diagnosis Date Noted   Nausea 05/19/2021   Abdominal pain 05/19/2021   Diarrhea 05/19/2021   Chronic idiopathic constipation 02/10/2017   Ear fullness, bilateral 02/10/2017   Elevated blood pressure reading 02/10/2017   Tobacco use disorder 02/10/2017   Right-sided carotid artery disease (HCC) 04/27/2016       Review of Systems  Constitutional:  Negative for diaphoresis.  Eyes:  Negative for pain.  Respiratory:  Negative for shortness of breath.   Cardiovascular:  Negative for chest pain, palpitations and leg swelling.  Gastrointestinal:  Negative for abdominal pain.  Endocrine: Negative for polydipsia.  Skin:  Negative for rash.  Neurological:  Negative for dizziness, weakness and headaches.  Hematological:  Does not bruise/bleed easily.  All other systems reviewed and are negative.      Objective:   Physical Exam Constitutional:      Appearance: Normal appearance.  HENT:     Right Ear: Tympanic membrane normal.     Left Ear: Tympanic membrane normal.  Cardiovascular:     Rate and Rhythm: Normal rate and regular rhythm.     Heart sounds: Normal heart sounds.  Pulmonary:     Effort: Pulmonary effort is normal.     Breath sounds: Normal breath sounds.  Skin:    General: Skin is warm.  Neurological:     General: No focal deficit present.     Mental Status: She is alert and oriented to person, place, and time.  Psychiatric:        Mood and Affect: Mood normal.        Behavior: Behavior normal.    BP (!) 168/81   Pulse 69   Temp (!) 97.5 F (36.4 C) (Temporal)   Resp  20   Ht 5\' 4"  (1.626 m)   Wt 141 lb (64 kg)   SpO2 95%   BMI 24.20 kg/m         Assessment & Plan:   Rayhana Slider in today with chief complaint of Jaw pain radiating to right ear   1. Right ear pain Probable nerve pain from dental procedure - predniSONE (DELTASONE) 20 MG tablet; Take 2 tablets (40 mg total) by mouth daily with breakfast for 5 days. 2 po daily for 5 days  Dispense: 10 tablet; Refill: 0    The above assessment and management plan was discussed with the patient. The patient verbalized understanding of and has agreed to the management plan. Patient is aware to call the clinic if symptoms persist or worsen. Patient is aware when to return to the clinic for a follow-up visit. Patient educated on when it is appropriate to go to the emergency department.   Mary-Margaret Daphine Deutscher, FNP

## 2023-01-03 NOTE — Patient Instructions (Signed)
Earache, Adult An earache, or ear pain, can be caused by many things, including: An infection. Ear wax buildup. Ear pressure. Something in the ear that should not be there (foreign body). A sore throat. Tooth problems. Jaw problems. Treatment of the earache will depend on the cause. If the cause is not clear or cannot be known, you may need to watch your symptoms until your earache goes away or until a cause is found. Follow these instructions at home: Medicines Take or apply over-the-counter and prescription medicines only as told by your health care provider. If you were prescribed antibiotics, use them as told by your health care provider. Do not stop using the antibiotic even if you start to feel better. Do not put anything in your ear other than medicine that is prescribed by your health care provider. Managing pain     If directed, apply heat to the affected area as often as told by your health care provider. Use the heat source that your health care provider recommends, such as a moist heat pack or a heating pad. Place a towel between your skin and the heat source. Leave the heat on for 20-30 minutes. If your skin turns bright red, remove the heat right away to prevent burns. The risk of burns is higher if you cannot feel pain, heat, or cold. If directed, put ice on the affected area. To do this: Put ice in a plastic bag. Place a towel between your skin and the bag. Leave the ice on for 20 minutes, 2-3 times a day. If your skin turns bright red, remove the ice right away to prevent skin damage. The risk of skin damage is higher if you cannot feel pain, heat, or cold.  General instructions Pay attention to any changes in your symptoms. Try resting in an upright position instead of lying down. This may help to reduce pressure in your ear and relieve pain. Chew gum if it helps to relieve your ear pain. Treat any allergies as told by your health care provider. Drink enough fluid  to keep your urine pale yellow. It is up to you to get the results of any tests that were done. Ask your health care provider, or the department that is doing the tests, when your results will be ready. Contact a health care provider if: Your pain does not improve within 2 days. Your earache gets worse. You have new symptoms. You have a fever. Get help right away if: You have a severe headache. You have a stiff neck. You have trouble swallowing. You have redness or swelling behind your ear. You have fluid or blood coming from your ear. You have hearing loss. You feel dizzy. This information is not intended to replace advice given to you by your health care provider. Make sure you discuss any questions you have with your health care provider. Document Revised: 10/11/2021 Document Reviewed: 10/11/2021 Elsevier Patient Education  2024 Elsevier Inc.  

## 2023-03-20 ENCOUNTER — Other Ambulatory Visit: Payer: Self-pay | Admitting: Obstetrics and Gynecology

## 2023-03-20 DIAGNOSIS — Z1231 Encounter for screening mammogram for malignant neoplasm of breast: Secondary | ICD-10-CM

## 2023-04-13 ENCOUNTER — Telehealth: Payer: Self-pay | Admitting: Family Medicine

## 2023-04-14 NOTE — Telephone Encounter (Signed)
ok 

## 2023-04-14 NOTE — Telephone Encounter (Signed)
That is fine 

## 2023-04-21 ENCOUNTER — Encounter (HOSPITAL_COMMUNITY): Payer: Self-pay

## 2023-04-21 ENCOUNTER — Inpatient Hospital Stay: Payer: Self-pay | Attending: Obstetrics and Gynecology | Admitting: Hematology and Oncology

## 2023-04-21 ENCOUNTER — Ambulatory Visit (HOSPITAL_COMMUNITY)
Admission: RE | Admit: 2023-04-21 | Discharge: 2023-04-21 | Disposition: A | Discharge status: Home / Self Care | Payer: Self-pay | Source: Ambulatory Visit | Attending: Obstetrics and Gynecology | Admitting: Obstetrics and Gynecology

## 2023-04-21 VITALS — BP 173/73 | Wt 145.7 lb

## 2023-04-21 DIAGNOSIS — Z1239 Encounter for other screening for malignant neoplasm of breast: Secondary | ICD-10-CM

## 2023-04-21 DIAGNOSIS — Z1231 Encounter for screening mammogram for malignant neoplasm of breast: Secondary | ICD-10-CM | POA: Insufficient documentation

## 2023-04-21 NOTE — Progress Notes (Signed)
Vicki Robinson is a 57 y.o. female who presents to [facility] clinic today with no complaints.    Pap Smear: Pap not smear completed today. Last Pap smear was 12/18/2020 at [facility] clinic and was normal. Per patient has no history of an abnormal Pap smear. Last Pap smear result is available in Epic.   Physical exam: Breasts Breasts symmetrical. No skin abnormalities bilateral breasts. No nipple retraction bilateral breasts. No nipple discharge bilateral breasts. No lymphadenopathy. No lumps palpated bilateral breasts.    MS DIGITAL SCREENING TOMO BILATERAL  Result Date: 02/07/2022 CLINICAL DATA:  Screening. EXAM: DIGITAL SCREENING BILATERAL MAMMOGRAM WITH TOMOSYNTHESIS AND CAD TECHNIQUE: Bilateral screening digital craniocaudal and mediolateral oblique mammograms were obtained. Bilateral screening digital breast tomosynthesis was performed. The images were evaluated with computer-aided detection. COMPARISON:  Previous exam(s). ACR Breast Density Category b: There are scattered areas of fibroglandular density. FINDINGS: There are no findings suspicious for malignancy. IMPRESSION: No mammographic evidence of malignancy. A result letter of this screening mammogram will be mailed directly to the patient. RECOMMENDATION: Screening mammogram in one year. (Code:SM-B-01Y) BI-RADS CATEGORY  1: Negative. Electronically Signed   By: Frederico Hamman M.D.   On: 02/07/2022 16:20   MS DIGITAL SCREENING TOMO BILATERAL  Result Date: 12/22/2020 CLINICAL DATA:  Screening. EXAM: DIGITAL SCREENING BILATERAL MAMMOGRAM WITH TOMOSYNTHESIS AND CAD TECHNIQUE: Bilateral screening digital craniocaudal and mediolateral oblique mammograms were obtained. Bilateral screening digital breast tomosynthesis was performed. The images were evaluated with computer-aided detection. COMPARISON:  Previous exam(s). ACR Breast Density Category b: There are scattered areas of fibroglandular density. FINDINGS: There are no findings suspicious for  malignancy. IMPRESSION: No mammographic evidence of malignancy. A result letter of this screening mammogram will be mailed directly to the patient. RECOMMENDATION: Screening mammogram in one year. (Code:SM-B-01Y) BI-RADS CATEGORY  1: Negative. Electronically Signed   By: Hulan Saas M.D.   On: 12/22/2020 15:15       Pelvic/Bimanual Pap is not indicated today    Smoking History: Patient is a current smoker and was referred to quit line. We will refer for low dose CT screening.   Patient Navigation: Patient education provided. Access to services provided for patient through [facility] program. No interpreter provided. No transportation provided   Colorectal Cancer Screening: Per patient has had colonoscopy completed on 01/21/2022  with benign results. No complaints today.    Breast and Cervical Cancer Risk Assessment: Patient does not have family history of breast cancer, known genetic mutations, or radiation treatment to the chest before age 52. Patient does not have history of cervical dysplasia, immunocompromised, or DES exposure in-utero.   Risk Scores as of Encounter on 04/21/2023     [name]           5-year 1%   Lifetime 5.47%   This patient is [place] but has no documented birth country, so the [place] model used data from [place] patients to calculate their risk score. Document a birth country in the Demographics activity for a more accurate score.         Last calculated by Narda Rutherford, LPN on 53/11/6438 at 12:39 PM         A: BCCCP exam without pap smear No complaints with benign exam.   P: Referred [facility] to [facility] for a screening mammogram. Appointment scheduled 04/21/2023.  Pascal Lux, NP 04/21/2023 12:05 PM

## 2023-04-21 NOTE — Patient Instructions (Signed)
Taught Vicki Robinson about self breast awareness and gave educational materials to take home. Patient did not need a Pap smear today due to last Pap smear was in 12/18/2020 per patient. Let her know BCCCP will cover Pap smears every 5 years unless has a history of abnormal Pap smears. [facility]. Appointment scheduled for 04/21/2023. Patient aware of appointment and will be there. Let patient know will follow up with her within the next couple weeks with results. Vicki Robinson verbalized understanding.  Pascal Lux, NP 12:05 PM

## 2023-05-09 ENCOUNTER — Telehealth: Payer: Self-pay

## 2023-05-09 NOTE — Telephone Encounter (Signed)
Patient telephoned, she received a bill for mammogram. Patient will mail a copy of the bill.

## 2023-05-15 ENCOUNTER — Ambulatory Visit: Payer: Self-pay | Admitting: Nurse Practitioner

## 2023-05-15 VITALS — BP 140/70 | HR 74 | Temp 97.6°F | Resp 20 | Ht 64.0 in | Wt 147.0 lb

## 2023-05-15 DIAGNOSIS — R42 Dizziness and giddiness: Secondary | ICD-10-CM

## 2023-05-15 NOTE — Patient Instructions (Signed)
Exercising to Stay Healthy To become healthy and stay healthy, it is recommended that you do moderate-intensity and vigorous-intensity exercise. You can tell that you are exercising at a moderate intensity if your heart starts beating faster and you start breathing faster but can still hold a conversation. You can tell that you are exercising at a vigorous intensity if you are breathing much harder and faster and cannot hold a conversation while exercising. How can exercise benefit me? Exercising regularly is important. It has many health benefits, such as: Improving overall fitness, flexibility, and endurance. Increasing bone density. Helping with weight control. Decreasing body fat. Increasing muscle strength and endurance. Reducing stress and tension, anxiety, depression, or anger. Improving overall health. What guidelines should I follow while exercising? Before you start a new exercise program, talk with your health care provider. Do not exercise so much that you hurt yourself, feel dizzy, or get very short of breath. Wear comfortable clothes and wear shoes with good support. Drink plenty of water while you exercise to prevent dehydration or heat stroke. Work out until your breathing and your heartbeat get faster (moderate intensity). How often should I exercise? Choose an activity that you enjoy, and set realistic goals. Your health care provider can help you make an activity plan that is individually designed and works best for you. Exercise regularly as told by your health care provider. This may include: Doing strength training two times a week, such as: Lifting weights. Using resistance bands. Push-ups. Sit-ups. Yoga. Doing a certain intensity of exercise for a given amount of time. Choose from these options: A total of 150 minutes of moderate-intensity exercise every week. A total of 75 minutes of vigorous-intensity exercise every week. A mix of moderate-intensity and  vigorous-intensity exercise every week. Children, pregnant women, people who have not exercised regularly, people who are overweight, and older adults may need to talk with a health care provider about what activities are safe to perform. If you have a medical condition, be sure to talk with your health care provider before you start a new exercise program. What are some exercise ideas? Moderate-intensity exercise ideas include: Walking 1 mile (1.6 km) in about 15 minutes. Biking. Hiking. Golfing. Dancing. Water aerobics. Vigorous-intensity exercise ideas include: Walking 4.5 miles (7.2 km) or more in about 1 hour. Jogging or running 5 miles (8 km) in about 1 hour. Biking 10 miles (16.1 km) or more in about 1 hour. Lap swimming. Roller-skating or in-line skating. Cross-country skiing. Vigorous competitive sports, such as football, basketball, and soccer. Jumping rope. Aerobic dancing. What are some everyday activities that can help me get exercise? Yard work, such as: Pushing a lawn mower. Raking and bagging leaves. Washing your car. Pushing a stroller. Shoveling snow. Gardening. Washing windows or floors. How can I be more active in my day-to-day activities? Use stairs instead of an elevator. Take a walk during your lunch break. If you drive, park your car farther away from your work or school. If you take public transportation, get off one stop early and walk the rest of the way. Stand up or walk around during all of your indoor phone calls. Get up, stretch, and walk around every 30 minutes throughout the day. Enjoy exercise with a friend. Support to continue exercising will help you keep a regular routine of activity. Where to find more information You can find more information about exercising to stay healthy from: U.S. Department of Health and Human Services: www.hhs.gov Centers for Disease Control and Prevention (  CDC): www.cdc.gov Summary Exercising regularly is  important. It will improve your overall fitness, flexibility, and endurance. Regular exercise will also improve your overall health. It can help you control your weight, reduce stress, and improve your bone density. Do not exercise so much that you hurt yourself, feel dizzy, or get very short of breath. Before you start a new exercise program, talk with your health care provider. This information is not intended to replace advice given to you by your health care provider. Make sure you discuss any questions you have with your health care provider. Document Revised: 09/25/2020 Document Reviewed: 09/25/2020 Elsevier Patient Education  2024 Elsevier Inc.  

## 2023-05-15 NOTE — Progress Notes (Signed)
Subjective:    Patient ID: Vicki Robinson, female    DOB: 29-Jun-1965, 57 y.o.   MRN: [id number]   Chief Complaint: Wants blood work   HPI  Patient has had vertigo intermittently the lat few months. Wants to have blood work done. No other issues Patient Active Problem List   Diagnosis Date Noted   Nausea 05/19/2021   Abdominal pain 05/19/2021   Diarrhea 05/19/2021   Chronic idiopathic constipation 02/10/2017   Ear fullness, bilateral 02/10/2017   Elevated blood pressure reading 02/10/2017   Tobacco use disorder 02/10/2017   Right-sided carotid artery disease (HCC) 04/27/2016   Patient here wanting some blood work done. She has not had a geanr    Review of Systems  Constitutional:  Negative for diaphoresis.  Eyes:  Negative for pain.  Respiratory:  Negative for shortness of breath.   Cardiovascular:  Negative for chest pain, palpitations and leg swelling.  Gastrointestinal:  Negative for abdominal pain.  Endocrine: Negative for polydipsia.  Skin:  Negative for rash.  Neurological:  Negative for dizziness, weakness and headaches.  Hematological:  Does not bruise/bleed easily.  All other systems reviewed and are negative.      Objective:   Physical Exam Vitals and nursing note reviewed.  Constitutional:      General: She is not in acute distress.    Appearance: Normal appearance. She is well-developed.  HENT:     Head: Normocephalic.     Right Ear: Tympanic membrane normal.     Left Ear: Tympanic membrane normal.     Nose: Nose normal.     Mouth/Throat:     Mouth: Mucous membranes are moist.  Eyes:     Pupils: Pupils are equal, round, and reactive to light.  Neck:     Vascular: No carotid bruit or JVD.  Cardiovascular:     Rate and Rhythm: Normal rate and regular rhythm.     Heart sounds: Normal heart sounds.  Pulmonary:     Effort: Pulmonary effort is normal. No respiratory distress.     Breath sounds: Normal breath sounds. No wheezing or rales.  Chest:      Chest wall: No tenderness.  Abdominal:     General: Bowel sounds are normal. There is no distension or abdominal bruit.     Palpations: Abdomen is soft. There is no hepatomegaly, splenomegaly, mass or pulsatile mass.     Tenderness: There is no abdominal tenderness.  Musculoskeletal:        General: Normal range of motion.     Cervical back: Normal range of motion and neck supple.  Lymphadenopathy:     Cervical: No cervical adenopathy.  Skin:    General: Skin is warm and dry.  Neurological:     General: No focal deficit present.     Mental Status: She is alert and oriented to person, place, and time.     Cranial Nerves: No cranial nerve deficit.     Sensory: No sensory deficit.     Deep Tendon Reflexes: Reflexes are normal and symmetric.  Psychiatric:        Behavior: Behavior normal.        Thought Content: Thought content normal.        Judgment: Judgment normal.     BP (!) 140/70   Pulse 74   Temp 97.6 F (36.4 C) (Temporal)   Resp 20   Ht 5\' 4"  (1.626 m)   Wt 147 lb (66.7 kg)   SpO2 98%  BMI 25.23 kg/m        Assessment & Plan:   Vicki Robinson in today with chief complaint of Wants blood work   1. Vertigo Force fluids rest - CBC with Differential/Platelet - CMP14+EGFR    The above assessment and management plan was discussed with the patient. The patient verbalized understanding of and has agreed to the management plan. Patient is aware to call the clinic if symptoms persist or worsen. Patient is aware when to return to the clinic for a follow-up visit. Patient educated on when it is appropriate to go to the emergency department.   Mary-Margaret Daphine Deutscher, FNP

## 2023-05-16 LAB — CMP14+EGFR
ALT: 14 [IU]/L (ref 0–32)
AST: 24 [IU]/L (ref 0–40)
Albumin: 4.2 g/dL (ref 3.8–4.9)
Alkaline Phosphatase: 105 [IU]/L (ref 44–121)
BUN/Creatinine Ratio: 19 (ref 9–23)
BUN: 14 mg/dL (ref 6–24)
Bilirubin Total: <0.2 mg/dL (ref 0.0–1.2)
CO2: 23 mmol/L (ref 20–29)
Calcium: 9.2 mg/dL (ref 8.7–10.2)
Chloride: 102 mmol/L (ref 96–106)
Creatinine, Ser: 0.75 mg/dL (ref 0.57–1.00)
Globulin, Total: 2.7 g/dL (ref 1.5–4.5)
Glucose: 86 mg/dL (ref 70–99)
Potassium: 4.7 mmol/L (ref 3.5–5.2)
Sodium: 140 mmol/L (ref 134–144)
Total Protein: 6.9 g/dL (ref 6.0–8.5)
eGFR: 92 mL/min/{1.73_m2} (ref >59)

## 2023-05-16 LAB — CBC WITH DIFFERENTIAL/PLATELET
Basophils Absolute: 0.1 10*3/uL (ref 0.0–0.2)
Basos: 1 %
EOS (ABSOLUTE): 0.2 10*3/uL (ref 0.0–0.4)
Eos: 3 %
Hematocrit: 44.3 % (ref 34.0–46.6)
Hemoglobin: 14.2 g/dL (ref 11.1–15.9)
Immature Grans (Abs): 0 10*3/uL (ref 0.0–0.1)
Immature Granulocytes: 0 %
Lymphocytes Absolute: 2.3 10*3/uL (ref 0.7–3.1)
Lymphs: 32 %
MCH: 30.1 pg (ref 26.6–33.0)
MCHC: 32.1 g/dL (ref 31.5–35.7)
MCV: 94 fL (ref 79–97)
Monocytes Absolute: 0.4 10*3/uL (ref 0.1–0.9)
Monocytes: 5 %
Neutrophils Absolute: 4.3 10*3/uL (ref 1.4–7.0)
Neutrophils: 59 %
Platelets: 295 10*3/uL (ref 150–450)
RBC: 4.71 x10E6/uL (ref 3.77–5.28)
RDW: 13.1 % (ref 11.7–15.4)
WBC: 7.2 10*3/uL (ref 3.4–10.8)

## 2023-06-15 ENCOUNTER — Other Ambulatory Visit: Payer: Self-pay | Admitting: Nurse Practitioner

## 2023-06-15 DIAGNOSIS — L500 Allergic urticaria: Secondary | ICD-10-CM

## 2023-06-15 DIAGNOSIS — F17200 Nicotine dependence, unspecified, uncomplicated: Secondary | ICD-10-CM

## 2023-07-10 ENCOUNTER — Telehealth: Payer: Self-pay

## 2023-07-10 DIAGNOSIS — Z87891 Personal history of nicotine dependence: Secondary | ICD-10-CM

## 2023-07-10 DIAGNOSIS — F17210 Nicotine dependence, cigarettes, uncomplicated: Secondary | ICD-10-CM

## 2023-07-10 DIAGNOSIS — Z122 Encounter for screening for malignant neoplasm of respiratory organs: Secondary | ICD-10-CM

## 2023-07-10 NOTE — Telephone Encounter (Signed)
.  Lung Cancer Screening Narrative/Criteria Questionnaire (Cigarette Smokers Only- No Cigars/Pipes/vapes)   Vicki Robinson   SDMV:07/17/2023 at 2:30pm with Keturah Barre, RN   17-May-1966   LDCT: 08/14/2023 at 2:30pm at [facility], requested March appt   58 y.o.   Phone: [phone] Home  Lung Screening Narrative (confirm age 41-77 yrs Medicare / 50-80 yrs Private pay insurance)   Insurance information:[employer] MemID# [id number]   Referring Provider:Bennie Pierini   This screening involves an initial phone call with a team member from our program. It is called a shared decision making visit. The initial meeting is required by  insurance and Medicare to make sure you understand the program. This appointment takes about 15-20 minutes to complete. You will complete the screening scan at your scheduled date/time.  This scan takes about 5-10 minutes to complete. You can eat and drink normally before and after the scan.  Criteria questions for Lung Cancer Screening:   Are you a current or former smoker? Current Age began smoking: 45   If you are a former smoker, what year did you quit smoking? (within 15 yrs)   To calculate your smoking history, I need an accurate estimate of how many packs of cigarettes you smoked per day and for how many years. (Not just the number of PPD you are now smoking)   Years smoking 41 x Packs per day .5 = Pack years 20.5   (at least 20 pack yrs)   (Make sure they understand that we need to know how much they have smoked in the past, not just the number of PPD they are smoking now)  Do you have a personal history of cancer?  No    Do you have a family history of cancer? No  Are you coughing up blood?  No  Have you had unexplained weight loss of 15 lbs or more in the last 6 months? No  It looks like you meet all criteria.  When would be a good time for [place] to schedule you for this screening?   Additional information:  .

## 2023-07-17 ENCOUNTER — Ambulatory Visit (INDEPENDENT_AMBULATORY_CARE_PROVIDER_SITE_OTHER): Payer: No Typology Code available for payment source | Admitting: Acute Care

## 2023-07-17 DIAGNOSIS — F17210 Nicotine dependence, cigarettes, uncomplicated: Secondary | ICD-10-CM

## 2023-07-17 NOTE — Patient Instructions (Signed)

## 2023-07-17 NOTE — Progress Notes (Signed)
 Provider Attestation I agree with the documentation of the Shared Decision Making visit,  smoking cessation counseling if appropriate, and verification or eligibility for lung cancer screening as documented by the RN Nurse Navigator.   Raejean Bullock, MSN, AGACNP-BC [facility] See Amion for personal pager PCCM on call pager [phone]    Virtual Visit via Telephone Note  I connected with Vicki Robinson on 07/17/23 at  2:30 PM EST by telephone and verified that I am speaking with the correct person using two identifiers.  Location: Patient: in home Provider: [address] [place], [place], [address]  Shared Decision Making Visit Lung Cancer Screening Program [phone])   Eligibility: Age 58 y.o. Pack Years Smoking History Calculation 20.5 (# packs/per year x # years smoked) Recent History of coughing up blood  no Unexplained weight loss? no ( >Than 15 pounds within the last 6 months ) Prior History Lung / other cancer no (Diagnosis within the last 5 years already requiring surveillance chest CT Scans). Smoking Status Current Smoker Former Smokers: Years since quit:  NA  Quit Date: NA  Visit Components: Discussion included one or more decision making aids. yes Discussion included risk/benefits of screening. yes Discussion included potential follow up diagnostic testing for abnormal scans. yes Discussion included meaning and risk of over diagnosis. yes Discussion included meaning and risk of False Positives. yes Discussion included meaning of total radiation exposure. yes  Counseling Included: Importance of adherence to annual lung cancer LDCT screening. yes Impact of comorbidities on ability to participate in the program. yes Ability and willingness to under diagnostic treatment. yes  Smoking Cessation Counseling: Current Smokers:  Discussed importance of smoking cessation. yes Information about tobacco cessation classes and  interventions provided to patient. yes Patient provided with "ticket" for LDCT Scan. yes Symptomatic Patient. no  Counseling NA Diagnosis Code: Tobacco Use Z72.0 Asymptomatic Patient yes  Counseling (Intermediate counseling: > three minutes counseling) M5784 Former Smokers:  Discussed the importance of maintaining cigarette abstinence. yes Diagnosis Code: Personal History of Nicotine Dependence. O96.295 Information about tobacco cessation classes and interventions provided to patient. Yes Patient provided with "ticket" for LDCT Scan. yes Written Order for Lung Cancer Screening with LDCT placed in Epic. Yes (CT Chest Lung Cancer Screening Low Dose W/O CM) [id number] Z12.2-Screening of respiratory organs Z87.891-Personal history of nicotine dependence   Valentin Gaskins, RN 07/17/23

## 2023-08-14 ENCOUNTER — Ambulatory Visit (HOSPITAL_COMMUNITY): Payer: No Typology Code available for payment source

## 2023-09-01 ENCOUNTER — Ambulatory Visit: Payer: Self-pay

## 2023-09-01 NOTE — Telephone Encounter (Signed)
 This encounter was created in error - please disregard.

## 2023-09-01 NOTE — Telephone Encounter (Signed)
 Voicemail left to call back office and speak to NT. Will place in callbacks  Copied from CRM [id number]. Topic: Clinical - Medical Advice >> Sep 01, 2023  2:35 PM Tiffany B wrote: Reason for CRM: Caller states she's experiencing dry cough, feels weak, nausea, body ache and back pain Caller states she would like to know if the clinic is testing for COVID.

## 2023-09-01 NOTE — Telephone Encounter (Signed)
 2nd attempt, this RN LVM requesting pt return call to office.

## 2023-09-01 NOTE — Telephone Encounter (Signed)
  Chief Complaint: Cough - dry . Other s/s have resolved Symptoms: cough Frequency: Monday Pertinent Negatives: Patient denies chest pain sob Disposition: [] ED /[] Urgent Care (no appt availability in office) / [] Appointment(In office/virtual)/ [x]  [facility]/ [x] Home Care/ [] Refused Recommended Disposition /[] [facility]/ []  Follow-up with PCP Additional Notes: Pt started s/s of URI on Monday/ Tuesday. BA, chills fever, sore throat. These have all resolved except for cough. Pt has dry cough. Pt had some Tessalon pearls that expired in 2022 that she took. She thinks they were helpful. Pt will continue to treat at home, however we have scheduled a VV for tomorrow in case s/s worsen. If pt feels better she will cancel VV.

## 2023-09-01 NOTE — Telephone Encounter (Signed)
 Copied from CRM [id number]. Topic: General - Call Back - No Documentation >> Sep 01, 2023  5:02 PM DeAngela L wrote: Reason for CRM: Patient returning call and warm transferred to NT line

## 2023-09-01 NOTE — Telephone Encounter (Addendum)
 Pt returned our call.   Reason for Disposition  Cough with cold symptoms (e.g., runny nose, postnasal drip, throat clearing)  Answer Assessment - Initial Assessment Questions 1. ONSET: "When did the cough begin?"      Monday 2. SEVERITY: "How bad is the cough today?"      moderate 3. SPUTUM: "Describe the color of your sputum" (none, dry cough; clear, white, yellow, green)     no 4. HEMOPTYSIS: "Are you coughing up any blood?" If so ask: "How much?" (flecks, streaks, tablespoons, etc.)     no 5. DIFFICULTY BREATHING: "Are you having difficulty breathing?" If Yes, ask: "How bad is it?" (e.g., mild, moderate, severe)    - MILD: No SOB at rest, mild SOB with walking, speaks normally in sentences, can lie down, no retractions, pulse < 100.    - MODERATE: SOB at rest, SOB with minimal exertion and prefers to sit, cannot lie down flat, speaks in phrases, mild retractions, audible wheezing, pulse 100-120.    - SEVERE: Very SOB at rest, speaks in single words, struggling to breathe, sitting hunched forward, retractions, pulse > 120      no 6. FEVER: "Do you have a fever?" If Yes, ask: "What is your temperature, how was it measured, and when did it start?"     Has resolved 7. CARDIAC HISTORY: "Do you have any history of heart disease?" (e.g., heart attack, congestive heart failure)      no 8. LUNG HISTORY: "Do you have any history of lung disease?"  (e.g., pulmonary embolus, asthma, emphysema)     no 9. PE RISK FACTORS: "Do you have a history of blood clots?" (or: recent major surgery, recent prolonged travel, bedridden)     no 10. OTHER SYMPTOMS: "Do you have any other symptoms?" (e.g., runny nose, wheezing, chest pain)       Stuffy nose has resolved, fever has resolved. BA,  sore throat and chills have resolved  Protocols used: Cough - Acute Non-Productive-A-AH

## 2023-09-01 NOTE — Addendum Note (Signed)
 Addended by: Osie Bond on: 09/01/2023 05:31 PM   Modules accepted: Orders, Level of Service

## 2023-09-02 ENCOUNTER — Telehealth: Admitting: Nurse Practitioner

## 2023-09-02 DIAGNOSIS — J069 Acute upper respiratory infection, unspecified: Secondary | ICD-10-CM

## 2023-09-02 MED ORDER — BENZONATATE 100 MG PO CAPS: 100.0000 mg | ORAL_CAPSULE | Freq: Two times a day (BID) | ORAL | 0 refills | Status: DC | PRN

## 2023-09-02 MED ORDER — ONDANSETRON 4 MG PO TBDP: 4.0000 mg | ORAL_TABLET | Freq: Three times a day (TID) | ORAL | 0 refills | Status: AC | PRN

## 2023-09-02 MED ORDER — PREDNISONE 20 MG PO TABS: 20.0000 mg | ORAL_TABLET | Freq: Every day | ORAL | 0 refills | Status: DC

## 2023-09-02 NOTE — Progress Notes (Signed)
 Virtual Visit Consent   Vicki Robinson, you are scheduled for a virtual visit with a [facility] provider today. Just as with appointments in the office, your consent must be obtained to participate. Your consent will be active for this visit and any virtual visit you may have with one of our providers in the next 365 days. If you have a MyChart account, a copy of this consent can be sent to you electronically.  As this is a virtual visit, video technology does not allow for your provider to perform a traditional examination. This may limit your provider's ability to fully assess your condition. If your provider identifies any concerns that need to be evaluated in person or the need to arrange testing (such as labs, EKG, etc.), we will make arrangements to do so. Although advances in technology are sophisticated, we cannot ensure that it will always work on either your end or our end. If the connection with a video visit is poor, the visit may have to be switched to a telephone visit. With either a video or telephone visit, we are not always able to ensure that we have a secure connection.  By engaging in this virtual visit, you consent to the provision of healthcare and authorize for your insurance to be billed (if applicable) for the services provided during this visit. Depending on your insurance coverage, you may receive a charge related to this service.  I need to obtain your verbal consent now. Are you willing to proceed with your visit today? Vicki Robinson has provided verbal consent on 09/02/2023 for a virtual visit (video or telephone). Claiborne Rigg, NP  Date: 09/02/2023 11:46 AM   Virtual Visit via Video Note   I, Claiborne Rigg, connected with  Vicki Robinson  ([id number], 02/29/1966) on 09/02/23 at 10:30 AM EDT by a video-enabled telemedicine application and verified that I am speaking with the correct person using two identifiers.  Location: Patient: Virtual Visit Location Patient:  Home Provider: Virtual Visit Location Provider: Home Office   I discussed the limitations of evaluation and management by telemedicine and the availability of in person appointments. The patient expressed understanding and agreed to proceed.    History of Present Illness: Vicki Robinson is a 58 y.o. who identifies as a female who was assigned female at birth, and is being seen today for uri with cough and congestion.  Ms Gamel has been experiencing symptoms of dry cough, sore throat, N/V, fatigue with Temp 101. Symptoms onset was 4 days ago.  She has not taken a COVID test. Tried dayquil tablets with only minimal relief of symptoms.   Problems:  Patient Active Problem List   Diagnosis Date Noted   Nausea 05/19/2021   Abdominal pain 05/19/2021   Diarrhea 05/19/2021   Chronic idiopathic constipation 02/10/2017   Ear fullness, bilateral 02/10/2017   Elevated blood pressure reading 02/10/2017   Tobacco use disorder 02/10/2017   Right-sided carotid artery disease (HCC) 04/27/2016    Allergies:  Allergies  Allergen Reactions   Penicillins Hives   Medications:  Current Outpatient Medications:    benzonatate (TESSALON) 100 MG capsule, Take 1 capsule (100 mg total) by mouth 2 (two) times daily as needed for cough., Disp: 30 capsule, Rfl: 0   ondansetron (ZOFRAN-ODT) 4 MG disintegrating tablet, Take 1 tablet (4 mg total) by mouth every 8 (eight) hours as needed for nausea or vomiting. Dissolvable tablets, Disp: 20 tablet, Rfl: 0   predniSONE (DELTASONE) 20 MG tablet, Take 1 tablet (  20 mg total) by mouth daily with breakfast., Disp: 5 tablet, Rfl: 0   Aspirin-Salicylamide-Caffeine (BC HEADACHE POWDER PO), Take 0.5-1 packets by mouth 2 (two) times daily as needed (pain.)., Disp: , Rfl:    diclofenac (VOLTAREN) 75 MG EC tablet, Take 75 mg by mouth daily as needed (pain.)., Disp: , Rfl:    Menthol, Topical Analgesic, (BIOFREEZE ROLL-ON EX), Apply 1 Application topically 3 (three) times daily as  needed (pain.)., Disp: , Rfl:   Observations/Objective: Patient is well-developed, well-nourished in no acute distress.  Resting comfortably at home.  Head is normocephalic, atraumatic.  No labored breathing.  Speech is clear and coherent with logical content.  Patient is alert and oriented at baseline.    Assessment and Plan: 1. URI with cough and congestion (Primary) - predniSONE (DELTASONE) 20 MG tablet; Take 1 tablet (20 mg total) by mouth daily with breakfast.  Dispense: 5 tablet; Refill: 0 - benzonatate (TESSALON) 100 MG capsule; Take 1 capsule (100 mg total) by mouth 2 (two) times daily as needed for cough.  Dispense: 30 capsule; Refill: 0 - ondansetron (ZOFRAN-ODT) 4 MG disintegrating tablet; Take 1 tablet (4 mg total) by mouth every 8 (eight) hours as needed for nausea or vomiting. Dissolvable tablets  Dispense: 20 tablet; Refill: 0  INSTRUCTIONS: use a humidifier for nasal congestion Drink plenty of fluids, rest and wash hands frequently to avoid the spread of infection Alternate tylenol and Motrin for relief of fever   Follow Up Instructions: I discussed the assessment and treatment plan with the patient. The patient was provided an opportunity to ask questions and all were answered. The patient agreed with the plan and demonstrated an understanding of the instructions.  A copy of instructions were sent to the patient via MyChart unless otherwise noted below.    The patient was advised to call back or seek an in-person evaluation if the symptoms worsen or if the condition fails to improve as anticipated.    Claiborne Rigg, NP

## 2023-09-02 NOTE — Patient Instructions (Signed)
 Rick Duff, thank you for joining Claiborne Rigg, NP for today's virtual visit.  While this provider is not your primary care provider (PCP), if your PCP is located in our provider database this encounter information will be shared with them immediately following your visit.   A [facility] MyChart account gives you access to today's visit and all your visits, tests, and labs performed at [facility] " click here if you don't have a [facility] MyChart account or go to [email or web address]  Consent: (Patient) Vicki Robinson provided verbal consent for this virtual visit at the beginning of the encounter.  Current Medications:  Current Outpatient Medications:    benzonatate (TESSALON) 100 MG capsule, Take 1 capsule (100 mg total) by mouth 2 (two) times daily as needed for cough., Disp: 30 capsule, Rfl: 0   ondansetron (ZOFRAN-ODT) 4 MG disintegrating tablet, Take 1 tablet (4 mg total) by mouth every 8 (eight) hours as needed for nausea or vomiting. Dissolvable tablets, Disp: 20 tablet, Rfl: 0   predniSONE (DELTASONE) 20 MG tablet, Take 1 tablet (20 mg total) by mouth daily with breakfast., Disp: 5 tablet, Rfl: 0   Aspirin-Salicylamide-Caffeine (BC HEADACHE POWDER PO), Take 0.5-1 packets by mouth 2 (two) times daily as needed (pain.)., Disp: , Rfl:    diclofenac (VOLTAREN) 75 MG EC tablet, Take 75 mg by mouth daily as needed (pain.)., Disp: , Rfl:    Menthol, Topical Analgesic, (BIOFREEZE ROLL-ON EX), Apply 1 Application topically 3 (three) times daily as needed (pain.)., Disp: , Rfl:    Medications ordered in this encounter:  Meds ordered this encounter  Medications   predniSONE (DELTASONE) 20 MG tablet    Sig: Take 1 tablet (20 mg total) by mouth daily with breakfast.    Dispense:  5 tablet    Refill:  0    Supervising Provider:   Merrilee Jansky [[id number]]   benzonatate (TESSALON) 100 MG capsule    Sig: Take 1 capsule (100 mg total) by mouth 2 (two) times daily as  needed for cough.    Dispense:  30 capsule    Refill:  0    Supervising Provider:   LAMPTEY, PHILIP O [[id number]]   ondansetron (ZOFRAN-ODT) 4 MG disintegrating tablet    Sig: Take 1 tablet (4 mg total) by mouth every 8 (eight) hours as needed for nausea or vomiting. Dissolvable tablets    Dispense:  20 tablet    Refill:  0    Supervising Provider:   Merrilee Jansky [[id number]]     *If you need refills on other medications prior to your next appointment, please contact your pharmacy*  Follow-Up: Call back or seek an in-person evaluation if the symptoms worsen or if the condition fails to improve as anticipated.  [facility] [phone]  Other Instructions INSTRUCTIONS: use a humidifier for nasal congestion Drink plenty of fluids, rest and wash hands frequently to avoid the spread of infection Alternate tylenol and Motrin for relief of fever    If you have been instructed to have an in-person evaluation today at a local Urgent Care facility, please use the link below. It will take you to a list of all of our available [facility], including address, phone number and hours of operation. Please do not delay care.  [facility]  If you or a family member do not have a primary care provider, use the link below to schedule a visit and establish care. When you choose  a [facility] primary care physician or advanced practice provider, you gain a long-term partner in health. Find a Primary Care Provider  Learn more about [facility]'s in-office and virtual care options: [facility] - Get Care Now

## 2023-09-04 NOTE — Telephone Encounter (Signed)
Pt had video visit.

## 2023-12-05 NOTE — Telephone Encounter (Signed)
**Note De-identified  [place] Obfuscation** Please advise 

## 2024-02-13 ENCOUNTER — Encounter: Payer: Self-pay | Admitting: Acute Care

## 2024-04-23 ENCOUNTER — Ambulatory Visit: Admitting: Nurse Practitioner

## 2024-04-23 ENCOUNTER — Encounter: Payer: Self-pay | Admitting: Nurse Practitioner

## 2024-04-23 VITALS — BP 157/83 | HR 77 | Temp 97.6°F | Ht 64.0 in | Wt 152.0 lb

## 2024-04-23 DIAGNOSIS — M5432 Sciatica, left side: Secondary | ICD-10-CM

## 2024-04-23 MED ORDER — PREDNISONE 20 MG PO TABS: 40.0000 mg | ORAL_TABLET | Freq: Every day | ORAL | 0 refills | Status: AC

## 2024-04-23 MED ORDER — METHYLPREDNISOLONE ACETATE 80 MG/ML IJ SUSP
80.0000 mg | Freq: Once | INTRAMUSCULAR | Status: AC
  Administered 2024-04-23: 80 mg via INTRAMUSCULAR

## 2024-04-23 MED ORDER — KETOROLAC TROMETHAMINE 60 MG/2ML IM SOLN
60.0000 mg | Freq: Once | INTRAMUSCULAR | Status: AC
  Administered 2024-04-23: 60 mg via INTRAMUSCULAR

## 2024-04-23 NOTE — Patient Instructions (Signed)
Sciatica  Sciatica is pain, numbness, weakness, or tingling along the path of the sciatic nerve. The sciatic nerve starts in the lower back and runs down the back of each leg. The nerve controls the muscles in the lower leg and in the back of the knee. It also provides feeling (sensation) to the back of the thigh, the lower leg, and the sole of the foot. Sciatica is a symptom of another medical condition that pinches or puts pressure on the sciatic nerve. Sciatica most often only affects one side of the body. Sciatica usually goes away on its own or with treatment. In some cases, sciatica may come back (recur). What are the causes? This condition is caused by pressure on the sciatic nerve or pinching of the nerve. This may be the result of: A disk in between the bones of the spine bulging out too far (herniated disk). Age-related changes in the spinal disks. A pain disorder that affects a muscle in the buttock. Extra bone growth near the sciatic nerve. A break (fracture) of the pelvis. Pregnancy. Tumor. This is rare. What increases the risk? The following factors may make you more likely to develop this condition: Playing sports that place pressure or stress on the spine. Having poor strength and flexibility. A history of back injury or surgery. Sitting for long periods of time. Doing activities that involve repetitive bending or lifting. Obesity. What are the signs or symptoms? Symptoms can vary from mild to very severe. They may include: Any of the following problems in the lower back, leg, hip, or buttock: Mild tingling, numbness, or dull aches. Burning sensations. Sharp pains. Numbness in the back of the calf or the sole of the foot. Leg weakness. Severe back pain that makes movement difficult. Symptoms may get worse when you cough, sneeze, or laugh, or when you sit or stand for long periods of time. How is this diagnosed? This condition may be diagnosed based on: Your symptoms  and medical history. A physical exam. Blood tests. Imaging tests, such as: X-rays. An MRI. A CT scan. How is this treated? In many cases, this condition improves on its own without treatment. However, treatment may include: Reducing or modifying physical activity. Exercising, including strengthening and stretching. Icing and applying heat to the affected area. Medicines that help to: Relieve pain and swelling. Relax your muscles. Injections of medicines that help to relieve pain and inflammation (steroids) around the sciatic nerve. Surgery. Follow these instructions at home: Medicines Take over-the-counter and prescription medicines only as told by your health care provider. Ask your health care provider if the medicine prescribed to you requires you to avoid driving or using heavy machinery. Managing pain     If directed, put ice on the affected area. To do this: Put ice in a plastic bag. Place a towel between your skin and the bag. Leave the ice on for 20 minutes, 2-3 times a day. If your skin turns bright red, remove the ice right away to prevent skin damage. The risk of skin damage is higher if you cannot feel pain, heat, or cold. If directed, apply heat to the affected area as often as told by your health care provider. Use the heat source that your health care provider recommends, such as a moist heat pack or a heating pad. Place a towel between your skin and the heat source. Leave the heat on for 20-30 minutes. If your skin turns bright red, remove the heat right away to prevent G[place]. The   risk of Jansma is higher if you cannot feel pain, heat, or cold. Activity  Return to your normal activities as told by your health care provider. Ask your health care provider what activities are safe for you. Avoid activities that make your symptoms worse. Take brief periods of rest throughout the day. When you rest for longer periods, mix in some mild activity or stretching between  periods of rest. This will help to prevent stiffness and pain. Avoid sitting for long periods of time without moving. Get up and move around at least one time each hour. Exercise and stretch regularly as told by your health care provider. Do not lift anything that is heavier than 10 lb (4.5 kg) until your health care provider says that it is safe. When you do not have symptoms, you should still avoid heavy lifting, especially repetitive heavy lifting. When you lift objects, always use proper lifting technique, which includes: Bending your knees. Keeping the load close to your body. Avoiding twisting. General instructions Maintain a healthy weight. Excess weight puts extra stress on your back. Wear supportive, comfortable shoes. Avoid wearing high heels. Avoid sleeping on a mattress that is too soft or too hard. A mattress that is firm enough to support your back when you sleep may help to reduce your pain. Contact a health care provider if: Your pain is not controlled by medicine. Your pain does not improve or gets worse. Your pain lasts longer than 4 weeks. You have unexplained weight loss. Get help right away if: You are not able to control when you urinate or have bowel movements (incontinence). You have: Weakness in your lower back, pelvis, buttocks, or legs that gets worse. Redness or swelling of your back. A burning sensation when you urinate. Summary Sciatica is pain, numbness, weakness, or tingling along the path of the sciatic nerve, which may include the lower back, legs, hips, and buttocks. This condition is caused by pressure on the sciatic nerve or pinching of the nerve. Treatment often includes rest, exercise, medicines, and applying ice or heat. This information is not intended to replace advice given to you by your health care provider. Make sure you discuss any questions you have with your health care provider. Document Revised: 09/06/2021 Document Reviewed:  09/06/2021 [employer] Patient Education  2024 [employer]  

## 2024-04-23 NOTE — Progress Notes (Signed)
   Subjective:    Patient ID: Vicki Robinson, female    DOB: [birth year], 58 y.o.   MRN: [id number]   Chief Complaint: Pain in left buttock (Radiates around to front and down left leg)   HPI Patient come sin today c/o left buttocks pain that radiates around groin area. She started seeing chiropractor which has helped a little. Had bad day yesterday. Rates pain 7/10 currently. Laying on left side increases pain. Certain movements increase pain. Walking makes pain better. Patient Active Problem List   Diagnosis Date Noted   Nausea 05/19/2021   Abdominal pain 05/19/2021   Diarrhea 05/19/2021   Chronic idiopathic constipation 02/10/2017   Ear fullness, bilateral 02/10/2017   Elevated blood pressure reading 02/10/2017   Tobacco use disorder 02/10/2017   Right-sided carotid artery disease 04/27/2016       Review of Systems  Musculoskeletal:  Positive for back pain.       Objective:   Physical Exam Constitutional:      Appearance: Normal appearance. She is obese.  Cardiovascular:     Rate and Rhythm: Normal rate and regular rhythm.     Heart sounds: Normal heart sounds.  Pulmonary:     Breath sounds: Normal breath sounds.  Musculoskeletal:     Comments: FROM of lumbar spine without  increase in pain (-) SLR bil Motor strength and sensation intact  Skin:    General: Skin is warm.  Neurological:     General: No focal deficit present.     Mental Status: She is alert and oriented to person, place, and time.  Psychiatric:        Mood and Affect: Mood normal.        Behavior: Behavior normal.     BP (!) 157/83   Pulse 77   Temp 97.6 F (36.4 C) (Temporal)   Ht 5' 4 (1.626 m)   Wt 152 lb (68.9 kg)   SpO2 98%   BMI 26.09 kg/m        Assessment & Plan:   Vicki Robinson in today with chief complaint of Pain in left buttock (Radiates around to front and down left leg)   1. Sciatica of left side (Primary) Moist heat Back stretches RTO prn - predniSONE  (DELTASONE )  20 MG tablet; Take 2 tablets (40 mg total) by mouth daily with breakfast for 5 days. 2 po daily for 5 days  Dispense: 10 tablet; Refill: 0 - methylPREDNISolone acetate (DEPO-MEDROL) injection 80 mg - ketorolac (TORADOL) injection 60 mg    The above assessment and management plan was discussed with the patient. The patient verbalized understanding of and has agreed to the management plan. Patient is aware to call the clinic if symptoms persist or worsen. Patient is aware when to return to the clinic for a follow-up visit. Patient educated on when it is appropriate to go to the emergency department.   Vicki Gladis, FNP

## 2024-05-08 ENCOUNTER — Telehealth: Payer: Self-pay

## 2024-05-08 NOTE — Telephone Encounter (Signed)
 Telephoned patient at home number. Patient has active [employer] health coverage at this time. Patient will call back in January. [facility]
